# Patient Record
Sex: Male | Born: 1970 | Race: Black or African American | Hispanic: No | Marital: Single | Smoking: Current every day smoker
Health system: Southern US, Community
[De-identification: ages and names within clinical notes are randomized; demographics above are authoritative.]

## PROBLEM LIST (undated history)

## (undated) DIAGNOSIS — R079 Chest pain, unspecified: Secondary | ICD-10-CM

## (undated) DIAGNOSIS — F419 Anxiety disorder, unspecified: Secondary | ICD-10-CM

## (undated) DIAGNOSIS — K219 Gastro-esophageal reflux disease without esophagitis: Secondary | ICD-10-CM

## (undated) DIAGNOSIS — F32A Depression, unspecified: Secondary | ICD-10-CM

## (undated) DIAGNOSIS — Z87898 Personal history of other specified conditions: Secondary | ICD-10-CM

## (undated) HISTORY — DX: Anxiety disorder, unspecified: F41.9

## (undated) HISTORY — DX: Depression, unspecified: F32.A

## (undated) HISTORY — DX: Personal history of other specified conditions: Z87.898

## (undated) HISTORY — DX: Chest pain, unspecified: R07.9

## (undated) HISTORY — DX: Gastro-esophageal reflux disease without esophagitis: K21.9

---

## 2015-09-21 ENCOUNTER — Encounter (HOSPITAL_COMMUNITY): Payer: Self-pay | Admitting: Emergency Medicine

## 2015-09-21 ENCOUNTER — Inpatient Hospital Stay (HOSPITAL_COMMUNITY)
Admission: EM | Admit: 2015-09-21 | Discharge: 2015-09-25 | DRG: 392 | Disposition: A | Discharge status: Home / Self Care | Payer: Self-pay | Attending: Internal Medicine | Admitting: Internal Medicine

## 2015-09-21 ENCOUNTER — Emergency Department (HOSPITAL_COMMUNITY): Payer: Self-pay

## 2015-09-21 DIAGNOSIS — R0789 Other chest pain: Secondary | ICD-10-CM | POA: Diagnosis present

## 2015-09-21 DIAGNOSIS — Z7289 Other problems related to lifestyle: Secondary | ICD-10-CM | POA: Diagnosis present

## 2015-09-21 DIAGNOSIS — H9203 Otalgia, bilateral: Secondary | ICD-10-CM | POA: Diagnosis present

## 2015-09-21 DIAGNOSIS — Z720 Tobacco use: Secondary | ICD-10-CM | POA: Diagnosis present

## 2015-09-21 DIAGNOSIS — Z789 Other specified health status: Secondary | ICD-10-CM | POA: Diagnosis present

## 2015-09-21 DIAGNOSIS — K921 Melena: Secondary | ICD-10-CM | POA: Diagnosis present

## 2015-09-21 DIAGNOSIS — K209 Esophagitis, unspecified without bleeding: Secondary | ICD-10-CM

## 2015-09-21 DIAGNOSIS — F1010 Alcohol abuse, uncomplicated: Secondary | ICD-10-CM | POA: Diagnosis present

## 2015-09-21 DIAGNOSIS — H9209 Otalgia, unspecified ear: Secondary | ICD-10-CM | POA: Diagnosis present

## 2015-09-21 DIAGNOSIS — F17210 Nicotine dependence, cigarettes, uncomplicated: Secondary | ICD-10-CM | POA: Diagnosis present

## 2015-09-21 DIAGNOSIS — K210 Gastro-esophageal reflux disease with esophagitis: Principal | ICD-10-CM | POA: Diagnosis present

## 2015-09-21 DIAGNOSIS — H81391 Other peripheral vertigo, right ear: Secondary | ICD-10-CM | POA: Diagnosis present

## 2015-09-21 DIAGNOSIS — R072 Precordial pain: Secondary | ICD-10-CM | POA: Diagnosis present

## 2015-09-21 DIAGNOSIS — K922 Gastrointestinal hemorrhage, unspecified: Secondary | ICD-10-CM

## 2015-09-21 DIAGNOSIS — R079 Chest pain, unspecified: Secondary | ICD-10-CM | POA: Diagnosis present

## 2015-09-21 DIAGNOSIS — R066 Hiccough: Secondary | ICD-10-CM | POA: Diagnosis present

## 2015-09-21 DIAGNOSIS — K449 Diaphragmatic hernia without obstruction or gangrene: Secondary | ICD-10-CM | POA: Diagnosis present

## 2015-09-21 LAB — POC OCCULT BLOOD, ED: Fecal Occult Bld: POSITIVE — AB

## 2015-09-21 LAB — CBC WITH DIFFERENTIAL/PLATELET
Basophils Absolute: 0 10*3/uL (ref 0.0–0.1)
Basophils Relative: 0 %
EOS PCT: 2 %
Eosinophils Absolute: 0.1 10*3/uL (ref 0.0–0.7)
HCT: 38.7 % — ABNORMAL LOW (ref 39.0–52.0)
HEMOGLOBIN: 13.7 g/dL (ref 13.0–17.0)
LYMPHS ABS: 2.6 10*3/uL (ref 0.7–4.0)
LYMPHS PCT: 32 %
MCH: 31.4 pg (ref 26.0–34.0)
MCHC: 35.4 g/dL (ref 30.0–36.0)
MCV: 88.8 fL (ref 78.0–100.0)
Monocytes Absolute: 0.7 10*3/uL (ref 0.1–1.0)
Monocytes Relative: 8 %
Neutro Abs: 4.7 10*3/uL (ref 1.7–7.7)
Neutrophils Relative %: 58 %
PLATELETS: 259 10*3/uL (ref 150–400)
RBC: 4.36 MIL/uL (ref 4.22–5.81)
RDW: 13.1 % (ref 11.5–15.5)
WBC: 8.2 10*3/uL (ref 4.0–10.5)

## 2015-09-21 LAB — COMPREHENSIVE METABOLIC PANEL
ALK PHOS: 39 U/L (ref 38–126)
ALT: 16 U/L — AB (ref 17–63)
ANION GAP: 7 (ref 5–15)
AST: 21 U/L (ref 15–41)
Albumin: 4 g/dL (ref 3.5–5.0)
BUN: 23 mg/dL — AB (ref 6–20)
CO2: 26 mmol/L (ref 22–32)
Calcium: 9 mg/dL (ref 8.9–10.3)
Chloride: 104 mmol/L (ref 101–111)
Creatinine, Ser: 1.22 mg/dL (ref 0.61–1.24)
Glucose, Bld: 108 mg/dL — ABNORMAL HIGH (ref 65–99)
Potassium: 4 mmol/L (ref 3.5–5.1)
Sodium: 137 mmol/L (ref 135–145)
TOTAL PROTEIN: 6.7 g/dL (ref 6.5–8.1)
Total Bilirubin: 0.3 mg/dL (ref 0.3–1.2)

## 2015-09-21 LAB — D-DIMER, QUANTITATIVE (NOT AT ARMC): D DIMER QUANT: 0.31 ug{FEU}/mL (ref 0.00–0.50)

## 2015-09-21 LAB — PROTIME-INR
INR: 1.06 (ref 0.00–1.49)
Prothrombin Time: 13.6 seconds (ref 11.6–15.2)

## 2015-09-21 LAB — I-STAT TROPONIN, ED: Troponin i, poc: 0 ng/mL (ref 0.00–0.08)

## 2015-09-21 LAB — APTT: APTT: 28 s (ref 24–37)

## 2015-09-21 LAB — LIPASE, BLOOD: LIPASE: 20 U/L (ref 11–51)

## 2015-09-21 MED ORDER — ONDANSETRON HCL 4 MG/2ML IJ SOLN
4.0000 mg | Freq: Once | INTRAMUSCULAR | Status: AC
  Administered 2015-09-21: 4 mg via INTRAVENOUS
  Filled 2015-09-21: qty 2

## 2015-09-21 MED ORDER — PANTOPRAZOLE SODIUM 40 MG IV SOLR
40.0000 mg | Freq: Once | INTRAVENOUS | Status: AC
  Administered 2015-09-21: 40 mg via INTRAVENOUS
  Filled 2015-09-21: qty 40

## 2015-09-21 MED ORDER — GI COCKTAIL ~~LOC~~
30.0000 mL | Freq: Once | ORAL | Status: AC
  Administered 2015-09-21: 30 mL via ORAL
  Filled 2015-09-21: qty 30

## 2015-09-21 MED ORDER — HYDROMORPHONE HCL 1 MG/ML IJ SOLN
1.0000 mg | Freq: Once | INTRAMUSCULAR | Status: AC
  Administered 2015-09-22: 1 mg via INTRAVENOUS
  Filled 2015-09-21: qty 1

## 2015-09-21 MED ORDER — SODIUM CHLORIDE 0.9 % IV SOLN
8.0000 mg/h | INTRAVENOUS | Status: DC
  Administered 2015-09-21 – 2015-09-23 (×4): 8 mg/h via INTRAVENOUS
  Filled 2015-09-21 (×8): qty 80

## 2015-09-21 MED ORDER — FAMOTIDINE IN NACL 20-0.9 MG/50ML-% IV SOLN
20.0000 mg | Freq: Once | INTRAVENOUS | Status: AC
  Administered 2015-09-22: 20 mg via INTRAVENOUS
  Filled 2015-09-21: qty 50

## 2015-09-21 MED ORDER — SODIUM CHLORIDE 0.9 % IV BOLUS (SEPSIS)
2000.0000 mL | Freq: Once | INTRAVENOUS | Status: AC
Start: 2015-09-21 — End: 2015-09-22
  Administered 2015-09-21: 2000 mL via INTRAVENOUS

## 2015-09-21 NOTE — ED Notes (Signed)
Hospitalist at bedside to evaluate pt.

## 2015-09-21 NOTE — ED Notes (Signed)
Pt c/o central chest pain with hiccoughs x 5 days, pt awake very diaphoretic with n/v. Pt states he is now becoming shob.

## 2015-09-21 NOTE — ED Provider Notes (Signed)
CSN: [id number]     Arrival date & time 09/21/15  2102 History   First MD Initiated Contact with Patient 09/21/15 2119     Chief Complaint  Patient presents with  . Chest Pain     (Consider location/radiation/quality/duration/timing/severity/associated sxs/prior Treatment) HPI Patient presents with 5 days of persistent hiccups. He developed central chest pain 3 days ago. States the pain is worse with deep breathing and hiccuping. He also has had recent nausea and vomiting. Vomited multiple times today. Denies any diarrhea. No fever or chills. Has had increasing shortness of breath. Also complains of upper abdominal pain. Has noticed recent black stools. No new lower extremity swelling or tenderness. Patient has been on vacation in [place] and states he's had increased alcohol consumption and been eating spicy salsa.  She also complains of nasal congestion, sinus pressure and right ear pressure. States he had been scuba diving in [place] and had mild upper respiratory symptoms. States he has intermittent spinning sensation associated with nausea. History reviewed. No pertinent past medical history. History reviewed. No pertinent past surgical history. History reviewed. No pertinent family history. Social History  Substance Use Topics  . Smoking status: Current Every Day Smoker  . Smokeless tobacco: None  . Alcohol Use: Yes    Review of Systems  Constitutional: Negative for fever and chills.  HENT: Positive for congestion, ear pain and sinus pressure. Negative for sore throat.   Respiratory: Positive for shortness of breath. Negative for cough and wheezing.   Cardiovascular: Positive for chest pain.  Gastrointestinal: Positive for nausea, vomiting and abdominal pain. Negative for diarrhea and constipation.  Genitourinary: Negative for dysuria, frequency, hematuria and flank pain.  Musculoskeletal: Negative for myalgias, back pain, neck pain and neck stiffness.  Skin: Negative for rash and  wound.  Neurological: Negative for dizziness, weakness, light-headedness, numbness and headaches.  All other systems reviewed and are negative.     Allergies  Review of patient's allergies indicates no known allergies.  Home Medications   Prior to Admission medications   Not on File   BP 131/84 mmHg  Pulse 94  Resp 19  SpO2 100% Physical Exam  Constitutional: He is oriented to person, place, and time. He appears well-developed and well-nourished. No distress.  HENT:  Head: Normocephalic and atraumatic.  Mouth/Throat: Oropharynx is clear and moist. No oropharyngeal exudate.  Left frontal sinus tenderness with percussion. Right TM opacification.  Eyes: EOM are normal. Pupils are equal, round, and reactive to light.  Injected bilateral sclera  Neck: Normal range of motion. Neck supple. No JVD present.  Cardiovascular: Normal rate and regular rhythm.  Exam reveals no gallop and no friction rub.   No murmur heard. Pulmonary/Chest: Effort normal and breath sounds normal. No respiratory distress. He has no wheezes. He has no rales. He exhibits tenderness (mild inferior sternal tenderness ).  Abdominal: Soft. Bowel sounds are normal. He exhibits no distension and no mass. There is tenderness (Tenderness to palpation in the epigastric region.). There is no rebound and no guarding.  Musculoskeletal: Normal range of motion. He exhibits no edema or tenderness.  No lower extremity asymmetry, tenderness or swelling. No CVA tenderness bilaterally.  Neurological: He is alert and oriented to person, place, and time.  Patient is alert and oriented x3 with clear, goal oriented speech. Patient has 5/5 motor in all extremities. Sensation is intact to light touch.   Skin: Skin is warm and dry. No rash noted. No erythema.  Psychiatric: He has a normal mood and  affect. His behavior is normal.  Nursing note and vitals reviewed.   ED Course  Procedures (including critical care time) Labs  Review Labs Reviewed  CBC WITH DIFFERENTIAL/PLATELET - Abnormal; Notable for the following:    HCT 38.7 (*)    All other components within normal limits  COMPREHENSIVE METABOLIC PANEL - Abnormal; Notable for the following:    Glucose, Bld 108 (*)    BUN 23 (*)    ALT 16 (*)    All other components within normal limits  POC OCCULT BLOOD, ED - Abnormal; Notable for the following:    Fecal Occult Bld POSITIVE (*)    All other components within normal limits  LIPASE, BLOOD  D-DIMER, QUANTITATIVE (NOT AT [facility])  URINALYSIS, ROUTINE W REFLEX MICROSCOPIC (NOT AT [facility])  OCCULT BLOOD X 1 CARD TO LAB, STOOL  PROTIME-INR  APTT  MAGNESIUM  PHOSPHORUS  I-STAT TROPOININ, ED  TYPE AND SCREEN  TYPE AND SCREEN    Imaging Review Dg Chest 2 View  09/21/2015  CLINICAL DATA:  Shortness of breath EXAM: CHEST  2 VIEW COMPARISON:  None. FINDINGS: The heart size and mediastinal contours are within normal limits. Both lungs are clear. The visualized skeletal structures are unremarkable. IMPRESSION: No active cardiopulmonary disease. Electronically Signed   By: Marnee SpringJonathon  Watts M.D.   On: 09/21/2015 22:05   I have personally reviewed and evaluated these images and lab results as part of my medical decision-making.   EKG Interpretation   Date/Time:  Sunday September 21 2015 21:07:58 EDT Ventricular Rate:  77 PR Interval:  101 QRS Duration: 84 QT Interval:  346 QTC Calculation: 391 R Axis:   88 Text Interpretation:  Sinus rhythm Short PR interval Probable left atrial  enlargement Minimal ST elevation, anterior leads Baseline wander in  lead(s) V2 Confirmed by Maddisyn Hegwood  MD, Breckyn Troyer ([id number]) on 09/21/2015 10:43:29  PM      MDM   Final diagnoses:  Upper GI bleed  Peripheral vertigo involving right ear  Atypical chest pain  Hiccups   Chest pain and epigastric pain is improved with GI cocktail. No obvious on EKG. Initial troponin is normal. Doubt CAD given atypical presentation. Patient does have a  history of 15 years of daily smoking though no family history. No evidence of free air on chest x-ray. Guaiac is positive. Hemoglobin is stable. Start on Protonix push and drip. Discussed with hospitalist and will admit to telemetry bed. Also discussed with [facility]. We'll see patient in the hospital.     Loren Raceravid Eleana Tocco, MD 09/21/15 2350

## 2015-09-21 NOTE — ED Notes (Signed)
Patient transported to X-ray 

## 2015-09-21 NOTE — H&P (Signed)
History and Physical    Caleb Brimaj Auxier UVO:[id number] DOB: [birth year] DOA: 09/21/2015  Referring MD/NP/PA: Loren Raceravid Yelverton, MD PCP: No primary care provider on file.  Outpatient Specialists:  Patient coming from: Home.  Chief Complaint: Chest pain.  HPI: Caleb Mcclure is a 45 y.o. male with no previous medical history who comes to the emergency department with complaints of hiccups for the past 5 days. He also has had chest pain, epigastric pain, nausea and vomiting for the last 3 days.  Per patient, he was doing well until earlier this week when he developed hiccups with some chest discomfort associated to it. The patient had booked a vacation in [place] for a few days and traveled there, even though he was feeling the symptoms. He is here states that during the week he was having some abdominal discomfort, but symptoms got a lot worse Friday evening after he ate a steak. He started having nausea and emesis. He says that prior to that, he was enjoying his vacation, went scuba diving on Friday, was eating and having alcoholic beverages offered by the resort.  He also states that earlier in the day, he noticed an episode of melena.   After the symptoms persisted, he went to an emergency department [place] [place] Friday evening and was given IV Valium. He states that this helped, but his symptoms persisted through the weekend. He was able to fly back today, but had to come to the emergency department after the abdominal pain, N/V worsened.  He denies using iron tablets, using bismuth or any other foods or medicine that would give him melena. However, the patient states that he use Pepto-Bismol earlier today to try to relieve his symptoms. He denies any history of gastritis, PUD or any other gastrointestinal disorders.  He also complains of right earache after the scuba diving session.  ED Course: Workup in the ER is unremarkable, except for positive fecal occult blood and mild ST segment  elevation on EKG.  Review of Systems: As per HPI otherwise 10 point review of systems negative.  History reviewed. No pertinent past medical history.  History reviewed. No pertinent past surgical history.   reports that he has been smoking.  He does not have any smokeless tobacco history on file. He reports that he drinks alcohol. He reports that he does not use illicit drugs.  No Known Allergies  History reviewed. No pertinent family history per patient.   Prior to Admission medications   Not on File    Physical Exam: Filed Vitals:   09/21/15 2117 09/21/15 2253  BP: 143/77 131/84  Pulse: 77 94  Resp: 20 19  SpO2: 100% 100%      Constitutional: NAD, calm, comfortable Filed Vitals:   09/21/15 2117 09/21/15 2253  BP: 143/77 131/84  Pulse: 77 94  Resp: 20 19  SpO2: 100% 100%   Eyes: PERRL, lids and conjunctivae normal ENMT: Mucous membranes are moist. Posterior pharynx clear of any exudate or lesions.Normal dentition.  Neck: normal, supple, no masses, no thyromegaly Respiratory: clear to auscultation bilaterally, no wheezing, no crackles. Normal respiratory effort. No accessory muscle use.  Cardiovascular: Regular rate and rhythm, no murmurs / rubs / gallops. No extremity edema. 2+ pedal pulses. No carotid bruits.  Abdomen: No distention. Bowel sounds positive. Mild epigastric tenderness without guarding or rebound, no masses palpated. No hepatosplenomegaly.  Musculoskeletal: no clubbing / cyanosis. No joint deformity upper and lower extremities. Good ROM, no contractures. Normal muscle tone.  Skin:  no rashes, lesions, ulcers. No induration Neurologic: CN 2-12 grossly intact. Sensation intact, DTR normal. Strength 5/5 in all 4.  Psychiatric: Normal judgment and insight. Alert and oriented x 3. Normal mood.   Labs on Admission: I have personally reviewed following labs and imaging studies  CBC:  Recent Labs Lab 09/21/15 2151  WBC 8.2  NEUTROABS 4.7  HGB 13.7    HCT 38.7*  MCV 88.8  PLT 259   Basic Metabolic Panel:  Recent Labs Lab 09/21/15 2151  NA 137  K 4.0  CL 104  CO2 26  GLUCOSE 108*  BUN 23*  CREATININE 1.22  CALCIUM 9.0   GFR: CrCl cannot be calculated (Unknown ideal weight.). Liver Function Tests:  Recent Labs Lab 09/21/15 2151  AST 21  ALT 16*  ALKPHOS 39  BILITOT 0.3  PROT 6.7  ALBUMIN 4.0    Recent Labs Lab 09/21/15 2151  LIPASE 20    Radiological Exams on Admission: Dg Chest 2 View  09/21/2015  CLINICAL DATA:  Shortness of breath EXAM: CHEST  2 VIEW COMPARISON:  None. FINDINGS: The heart size and mediastinal contours are within normal limits. Both lungs are clear. The visualized skeletal structures are unremarkable. IMPRESSION: No active cardiopulmonary disease. Electronically Signed   By: Marnee Spring M.D.   On: 09/21/2015 22:05    EKG: Independently reviewed. Vent. rate 77 BPM PR interval 101 ms QRS duration 84 ms QT/QTc 346/391 ms P-R-T axes 56 88 51 Sinus rhythm Short PR interval Probable left atrial enlargement Minimal ST elevation, anterior leads Baseline wander in lead(s) V2  Assessment/Plan Principal Problem:   UGI bleed Likely from a combination of stress, daily caffeine intake, tobacco and alcohol use, foot intake during vacation.   Admit to telemetry/inpatient. Nothing by mouth. Continue IV fluids. Continue pantoprazole infusion. Analgesics as needed. Antiemetics as needed. Monitor hematocrit and hemoglobin. GI will evaluate in a.m.  Active Problems:   Chest pain Continue cardiac monitoring. Trend troponin levels. Repeat EKG in a.m. Check echocardiogram in the morning.    Tobacco abuse disorder The patient states that he is very committed to quitting smoking. Nicotine replacement therapy ordered.    DVT prophylaxis: SCDs. Code Status: Full code. Family Communication:  Disposition Plan: Admit for monitoring and GI evaluation. Consults called:  Gastroenterology. Admission status: Inpatient/telemetry.   About 70 minutes were used in the process of this admission.   Bobette Mo MD [facility] Pager [phone].  If 7PM-7AM, please contact night-coverage [email or web address] Password [facility]  09/21/2015, 11:29 PM

## 2015-09-22 ENCOUNTER — Inpatient Hospital Stay (HOSPITAL_COMMUNITY): Payer: Self-pay

## 2015-09-22 DIAGNOSIS — K922 Gastrointestinal hemorrhage, unspecified: Secondary | ICD-10-CM

## 2015-09-22 DIAGNOSIS — Z720 Tobacco use: Secondary | ICD-10-CM

## 2015-09-22 DIAGNOSIS — H9203 Otalgia, bilateral: Secondary | ICD-10-CM

## 2015-09-22 DIAGNOSIS — R0789 Other chest pain: Secondary | ICD-10-CM | POA: Diagnosis present

## 2015-09-22 DIAGNOSIS — R079 Chest pain, unspecified: Secondary | ICD-10-CM

## 2015-09-22 LAB — PHOSPHORUS: Phosphorus: 3.4 mg/dL (ref 2.5–4.6)

## 2015-09-22 LAB — CBC
HCT: 37.7 % — ABNORMAL LOW (ref 39.0–52.0)
Hemoglobin: 13 g/dL (ref 13.0–17.0)
MCH: 31.6 pg (ref 26.0–34.0)
MCHC: 34.5 g/dL (ref 30.0–36.0)
MCV: 91.7 fL (ref 78.0–100.0)
PLATELETS: 236 10*3/uL (ref 150–400)
RBC: 4.11 MIL/uL — AB (ref 4.22–5.81)
RDW: 13.3 % (ref 11.5–15.5)
WBC: 7.1 10*3/uL (ref 4.0–10.5)

## 2015-09-22 LAB — URINALYSIS, ROUTINE W REFLEX MICROSCOPIC
Bilirubin Urine: NEGATIVE
GLUCOSE, UA: NEGATIVE mg/dL
HGB URINE DIPSTICK: NEGATIVE
KETONES UR: NEGATIVE mg/dL
Leukocytes, UA: NEGATIVE
Nitrite: NEGATIVE
PH: 7 (ref 5.0–8.0)
PROTEIN: NEGATIVE mg/dL
Specific Gravity, Urine: 1.025 (ref 1.005–1.030)

## 2015-09-22 LAB — LIPID PANEL
CHOLESTEROL: 131 mg/dL (ref 0–200)
HDL: 53 mg/dL (ref >40)
LDL Cholesterol: 63 mg/dL (ref 0–99)
TRIGLYCERIDES: 75 mg/dL (ref <150)
Total CHOL/HDL Ratio: 2.5 RATIO
VLDL: 15 mg/dL (ref 0–40)

## 2015-09-22 LAB — CBC WITH DIFFERENTIAL/PLATELET
Basophils Absolute: 0 10*3/uL (ref 0.0–0.1)
Basophils Relative: 0 %
EOS ABS: 0.1 10*3/uL (ref 0.0–0.7)
Eosinophils Relative: 1 %
HCT: 36.3 % — ABNORMAL LOW (ref 39.0–52.0)
HEMOGLOBIN: 12.6 g/dL — AB (ref 13.0–17.0)
LYMPHS ABS: 3.9 10*3/uL (ref 0.7–4.0)
LYMPHS PCT: 42 %
MCH: 31.8 pg (ref 26.0–34.0)
MCHC: 34.7 g/dL (ref 30.0–36.0)
MCV: 91.7 fL (ref 78.0–100.0)
Monocytes Absolute: 0.5 10*3/uL (ref 0.1–1.0)
Monocytes Relative: 6 %
NEUTROS ABS: 4.7 10*3/uL (ref 1.7–7.7)
NEUTROS PCT: 51 %
Platelets: 237 10*3/uL (ref 150–400)
RBC: 3.96 MIL/uL — AB (ref 4.22–5.81)
RDW: 13.2 % (ref 11.5–15.5)
WBC: 9.3 10*3/uL (ref 4.0–10.5)

## 2015-09-22 LAB — COMPREHENSIVE METABOLIC PANEL
ALK PHOS: 35 U/L — AB (ref 38–126)
ALT: 14 U/L — AB (ref 17–63)
AST: 16 U/L (ref 15–41)
Albumin: 3.5 g/dL (ref 3.5–5.0)
Anion gap: 4 — ABNORMAL LOW (ref 5–15)
BUN: 18 mg/dL (ref 6–20)
CALCIUM: 8.2 mg/dL — AB (ref 8.9–10.3)
CO2: 26 mmol/L (ref 22–32)
CREATININE: 1.22 mg/dL (ref 0.61–1.24)
Chloride: 110 mmol/L (ref 101–111)
GFR calc non Af Amer: >60 mL/min (ref >60)
GLUCOSE: 104 mg/dL — AB (ref 65–99)
Potassium: 4.5 mmol/L (ref 3.5–5.1)
SODIUM: 140 mmol/L (ref 135–145)
Total Bilirubin: 0.6 mg/dL (ref 0.3–1.2)
Total Protein: 5.9 g/dL — ABNORMAL LOW (ref 6.5–8.1)

## 2015-09-22 LAB — TYPE AND SCREEN
ABO/RH(D): B POS
Antibody Screen: NEGATIVE

## 2015-09-22 LAB — PROTIME-INR
INR: 1.14 (ref 0.00–1.49)
PROTHROMBIN TIME: 14.3 s (ref 11.6–15.2)

## 2015-09-22 LAB — ECHOCARDIOGRAM COMPLETE
HEIGHTINCHES: 73 in
WEIGHTICAEL: 3531.2 [oz_av]

## 2015-09-22 LAB — ABO/RH: ABO/RH(D): B POS

## 2015-09-22 LAB — RAPID URINE DRUG SCREEN, HOSP PERFORMED
AMPHETAMINES: NOT DETECTED
BARBITURATES: NOT DETECTED
BENZODIAZEPINES: POSITIVE — AB
COCAINE: NOT DETECTED
Opiates: NOT DETECTED
TETRAHYDROCANNABINOL: NOT DETECTED

## 2015-09-22 LAB — MAGNESIUM: Magnesium: 2 mg/dL (ref 1.7–2.4)

## 2015-09-22 LAB — TROPONIN I
Troponin I: <0.03 ng/mL (ref <0.031)
Troponin I: <0.03 ng/mL (ref <0.031)

## 2015-09-22 MED ORDER — MENTHOL 3 MG MT LOZG
1.0000 | LOZENGE | OROMUCOSAL | Status: DC | PRN
  Administered 2015-09-22: 3 mg via ORAL
  Filled 2015-09-22 (×2): qty 9

## 2015-09-22 MED ORDER — SUCRALFATE 1 GM/10ML PO SUSP
1.0000 g | Freq: Three times a day (TID) | ORAL | Status: DC
  Administered 2015-09-22 – 2015-09-25 (×9): 1 g via ORAL
  Filled 2015-09-22 (×14): qty 10

## 2015-09-22 MED ORDER — CIPROFLOXACIN-HYDROCORTISONE 0.2-1 % OT SUSP
3.0000 [drp] | Freq: Two times a day (BID) | OTIC | Status: DC
Start: 2015-09-22 — End: 2015-09-25
  Administered 2015-09-22 – 2015-09-24 (×6): 3 [drp] via OTIC
  Filled 2015-09-22: qty 10

## 2015-09-22 MED ORDER — SODIUM CHLORIDE 0.9% FLUSH
3.0000 mL | Freq: Two times a day (BID) | INTRAVENOUS | Status: DC
Start: 2015-09-22 — End: 2015-09-25
  Administered 2015-09-23: 3 mL via INTRAVENOUS

## 2015-09-22 MED ORDER — CHLORPROMAZINE HCL 10 MG PO TABS
10.0000 mg | ORAL_TABLET | Freq: Three times a day (TID) | ORAL | Status: DC | PRN
Start: 2015-09-22 — End: 2015-09-22
  Administered 2015-09-22: 10 mg via ORAL
  Filled 2015-09-22 (×3): qty 1

## 2015-09-22 MED ORDER — ONDANSETRON HCL 4 MG/2ML IJ SOLN
4.0000 mg | Freq: Four times a day (QID) | INTRAMUSCULAR | Status: DC | PRN
  Administered 2015-09-22: 4 mg via INTRAVENOUS
  Filled 2015-09-22: qty 2

## 2015-09-22 MED ORDER — CHLORPROMAZINE HCL 25 MG/ML IJ SOLN
12.5000 mg | Freq: Four times a day (QID) | INTRAMUSCULAR | Status: DC | PRN
  Administered 2015-09-22 – 2015-09-24 (×7): 12.5 mg via INTRAVENOUS
  Filled 2015-09-22 (×14): qty 0.5

## 2015-09-22 MED ORDER — HYDROMORPHONE HCL 1 MG/ML IJ SOLN
1.0000 mg | Freq: Four times a day (QID) | INTRAMUSCULAR | Status: DC | PRN
  Administered 2015-09-22 – 2015-09-24 (×10): 1 mg via INTRAVENOUS
  Filled 2015-09-22 (×11): qty 1

## 2015-09-22 MED ORDER — SODIUM CHLORIDE 0.9 % IV SOLN
INTRAVENOUS | Status: AC
  Administered 2015-09-22: via INTRAVENOUS

## 2015-09-22 MED ORDER — NICOTINE 21 MG/24HR TD PT24
21.0000 mg | MEDICATED_PATCH | Freq: Every day | TRANSDERMAL | Status: DC
  Administered 2015-09-22 – 2015-09-25 (×5): 21 mg via TRANSDERMAL
  Filled 2015-09-22 (×5): qty 1

## 2015-09-22 MED ORDER — CIPROFLOXACIN-FLUOCINOLONE PF 0.3-0.025 % OT SOLN: 0.2500 mL | Freq: Two times a day (BID) | OTIC | Status: DC

## 2015-09-22 MED ORDER — SODIUM CHLORIDE 0.9 % IV SOLN
INTRAVENOUS | Status: DC
  Administered 2015-09-22: 08:00:00 via INTRAVENOUS
  Administered 2015-09-22: 125 mL/h via INTRAVENOUS

## 2015-09-22 NOTE — Consult Note (Signed)
CARDIOLOGY CONSULT NOTE   Patient ID: Caleb Mcclure MRN: [id number] DOB/AGE: 04-Jan-1971 45 y.o.  Admit date: 09/21/2015  Primary Physician   No primary care provider on file. Primary Cardiologist   New Reason for Consultation  Chest discomfort Requesting Physician  Dr. Sidney Ace  HPI: Caleb Mcclure is a 45 y.o. male past medical history who came to [facility] emergency department for evaluation of epigastric pain, nausea, vomiting and hiccups.  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 Tuesday 4/18 he woke up with the dizziness and hiccups. His hiccups persisted and he went for vacation (previously booked) in [place], [place] Thursday 4/19. He take water with him. However during vacation his symptoms get worse and he later developed a abdominal/epigastric discomfort. He describes the pain as a squeezing in character. He also has some midsternal tight pain. He ate some steak on Friday 4/21 evening and later developed. Severe nausea with vomiting of coffee-colored stuff "different from food". He also went for scuba diving on Friday. He was seen in emergency department during vacation and was given IV Valium and discharged. His symptoms persisted and worsened over the weekend. He flew back yesterday 4/23 and directly came to [facility] long ED for further evaluation. He did use a one dose of Pepto-Bismol yesterday. Denies similar episode in the past. He swims 60 laps every day. No chest pain at that time. Patient smokes one pack of cigarettes every day for the past 15 years. He drinks approximately 2 beers, one glass of fine and one glass of water, during weekend. Admits to drinking heavily during vacation. Admits to having history of hemorrhoids.  In ER workup is unremarkable except positive fecal occult blood stool. EKG shows sinus rhythm @ Vent. rate 77 BPM; PR interval 101 ms, baseline ST segment elevation in inferior lead. No prior EKG to compare. Telemetry  shows sinus rhythm with a rate mostly in the 60s, PACs. No arrhythmia noted. Point-of-care troponin negative. Troponin I negative. Lytes normal. 60 clear.   The patient denies any family history of stroke or heart attack. Denies use of illicit drug use.  Denies family history of congenital heart disease. The patient denies fever, palpitations, shortness of breath, orthopnea, PND, dizziness, syncope, cough, congestion, lower extremity edema.    No past medical history or surgical history.   No Known Allergies  I have reviewed the patient's current medications . ciprofloxacin-hydrocortisone  3 drop Right Ear BID  . nicotine  21 mg Transdermal Daily  . sodium chloride flush  3 mL Intravenous Q12H   . sodium chloride 125 mL/hr at 09/22/15 0752  . pantoprozole (PROTONIX) infusion 8 mg/hr (09/22/15 0752)   chlorproMAZINE, HYDROmorphone (DILAUDID) injection, ondansetron (ZOFRAN) IV  Prior to Admission medications   Not on File     Social History   Social History  . Marital Status: Single    Spouse Name: N/A  . Number of Children: N/A  . Years of Education: N/A   Occupational History  . Not on file.   Social History Main Topics  . Smoking status: Current Every Day Smoker  . Smokeless tobacco: Not on file  . Alcohol Use: Yes  . Drug Use: No  . Sexual Activity: Not on file   Other Topics Concern  . Not on file   Social History Narrative  . No narrative on file    No family status information on file.    Family hx: Denies any family history of stroke or  heart attack.   ROS:  Full 14 point review of systems complete and found to be negative unless listed above.  Physical Exam: Blood pressure 112/59, pulse 72, temperature 98 F (36.7 C), temperature source Oral, resp. rate 18, height 6\' 1"  (1.854 m), weight 220 lb 11.2 oz (100.109 kg), SpO2 100 %.  General: Well developed, well nourished, male in no acute distress Head: Eyes PERRLA, No xanthomas. Normocephalic and  atraumatic, oropharynx without edema or exudate.  Lungs: Resp regular and unlabored, CTA. Heart: RRR no s3, s4, or murmurs..   Neck: No carotid bruits. No lymphadenopathy. No JVD. Abdomen: Bowel sounds present, tender to palpation in the epigastric area. Msk:  No spine or cva tenderness. No weakness, no joint deformities or effusions. Extremities: No clubbing, cyanosis or edema. DP/PT/Radials 2+ and equal bilaterally. Neuro: Alert and oriented X 3. No focal deficits noted. Psych:  Good affect, responds appropriately Skin: No rashes or lesions noted.  Labs:   Lab Results  Component Value Date   WBC 9.3 09/22/2015   HGB 12.6* 09/22/2015   HCT 36.3* 09/22/2015   MCV 91.7 09/22/2015   PLT 237 09/22/2015    Recent Labs  09/22/15 0509  INR 1.14    Recent Labs Lab 09/22/15 0509  NA 140  K 4.5  CL 110  CO2 26  BUN 18  CREATININE 1.22  CALCIUM 8.2*  PROT 5.9*  BILITOT 0.6  ALKPHOS 35*  ALT 14*  AST 16  GLUCOSE 104*  ALBUMIN 3.5   MAGNESIUM  Date Value Ref Range Status  09/21/2015 2.0 1.7 - 2.4 mg/dL Final    Recent Labs  40/98/1104/24/17 0509  TROPONINI <0.03    Recent Labs  09/21/15 2203  TROPIPOC 0.00   No results found for: PROBNP No results found for: CHOL, HDL, LDLCALC, TRIG Lab Results  Component Value Date   DDIMER 0.31 09/21/2015   LIPASE  Date/Time Value Ref Range Status  09/21/2015 09:51 PM 20 11 - 51 U/L Final   ECG:   EKG shows sinus rhythm @ Vent. rate 77 BPM; PR interval 101 ms, baseline ST segment elevation in inferior lead.   Radiology:  Dg Chest 2 View  09/21/2015  CLINICAL DATA:  Shortness of breath EXAM: CHEST  2 VIEW COMPARISON:  None. FINDINGS: The heart size and mediastinal contours are within normal limits. Both lungs are clear. The visualized skeletal structures are unremarkable. IMPRESSION: No active cardiopulmonary disease. Electronically Signed   By: Marnee SpringJonathon  Watts M.D.   On: 09/21/2015 22:05    ASSESSMENT AND PLAN:     1.  Chest pain - His chest pain is very atypical. He states that he had a few episode of mid sternal chest tightness with hiccups only. EKG shows sinus rhythm @ Vent. rate 77 BPM; PR interval 101 ms, baseline ST segment elevation in inferior lead.  Troponin negative. Tele showed sinus rhythm at rate of 60s with PACs. No arrhythmia.  - Cardiac risk factors include tobacco smoking and alcohol drinking. Will get lipid panel for stratification.  - Will get echocardiogram. If normal EF and no wall motion abnormality, no further workup needed. He swims 60 laps everyday without any chest pain or shortness of breath. Check urine drug screen.   2. Tobacco smoking - Advised smoking cessation. Education given.   3. UGI bleed - Pending GI consult. Hx of hemorrhoids.    SignedManson Passey: Kathrine Rieves, PA 09/22/2015, 10:11 AM Pager [phone]  Co-Sign MD

## 2015-09-22 NOTE — Care Management Note (Signed)
Case Management Note  Patient Details  Name: Laurian Brimaj Hollern MRN: [id number] Date of Birth: 12-04-1970  Subjective/Objective: 45 y/o m admitted w/UGIB. From home. Patient works. No health insurance-provided info for pcp, health insurance,meds. [facility]-TC [facility] liason w/[facility]-they will check on appt.[facility]-patient voiced understanding.                   Action/Plan:d/c plan home.   Expected Discharge Date:                 Expected Discharge Plan:  Home/Self Care  In-House Referral:     Discharge planning Services  CM Consult, Indigent [facility] Clinic  Post Acute Care Choice:    Choice offered to:     DME Arranged:    DME Agency:     HH Arranged:    HH Agency:     Status of Service:  In process, will continue to follow  Medicare Important Message Given:    Date Medicare IM Given:    Medicare IM give by:    Date Additional Medicare IM Given:    Additional Medicare Important Message give by:     If discussed at Long Length of Stay Meetings, dates discussed:    Additional Comments:  Lanier ClamMahabir, Harlem Bula, RN 09/22/2015, 1:00 PM

## 2015-09-22 NOTE — Consult Note (Signed)
[facility] Consultation Note  Referring Provider: Dr. Anand Hongalgi ([facility]) Primary Care Physician:  No primary care provider on file.  Reason for Consultation:  Coffee ground emesis; dark stools  HPI: Caleb Mcclure is a 44 y.o. male whom we've been asked to see for one week history of hiccoughs, and several day history of coffee ground emesis and dark stools.  No abdominal pain.  No bright red hematemesis or hematochezia.  No prior similar symptoms.  No NSAIDs.  Recent trip to [place], where he ate lots of seafood throughout and drank alcohol for one day.   History reviewed. No pertinent past medical history.  History reviewed. No pertinent past surgical history.  Prior to Admission medications   Not on File    Current Facility-Administered Medications  Medication Dose Route Frequency Provider Last Rate Last Dose  . 0.9 %  sodium chloride infusion   Intravenous Continuous Anand D Hongalgi, MD 100 mL/hr at 09/22/15 1130    . chlorproMAZINE (THORAZINE) 12.5 mg in sodium chloride 0.9 % 25 mL IVPB  12.5 mg Intravenous Q6H PRN Anand D Hongalgi, MD   12.5 mg at 09/22/15 1120  . ciprofloxacin-hydrocortisone (CIPRO HC OTIC) 0.2-1 % otic suspension 3 drop  3 drop Right Ear BID David Manuel Ortiz, MD   3 drop at 09/22/15 0926  . HYDROmorphone (DILAUDID) injection 1 mg  1 mg Intravenous Q6H PRN David Manuel Ortiz, MD   1 mg at 09/22/15 1117  . menthol-cetylpyridinium (CEPACOL) lozenge 3 mg  1 lozenge Oral PRN Anand D Hongalgi, MD      . nicotine (NICODERM CQ - dosed in mg/24 hours) patch 21 mg  21 mg Transdermal Daily David Manuel Ortiz, MD   21 mg at 09/22/15 0919  . ondansetron (ZOFRAN) injection 4 mg  4 mg Intravenous Q6H PRN David Manuel Ortiz, MD   4 mg at 09/22/15 0057  . pantoprazole (PROTONIX) 80 mg in sodium chloride 0.9 % 250 mL (0.32 mg/mL) infusion  8 mg/hr Intravenous Continuous David Yelverton, MD 25 mL/hr at 09/22/15 0752 8 mg/hr at 09/22/15 0752  . sodium chloride flush (NS)  0.9 % injection 3 mL  3 mL Intravenous Q12H David Manuel Ortiz, MD   3 mL at 09/22/15 0139  . sucralfate (CARAFATE) 1 GM/10ML suspension 1 g  1 g Oral TID WC & HS Anand D Hongalgi, MD   1 g at 09/22/15 1204    Allergies as of 09/21/2015  . (No Known Allergies)    History reviewed. No pertinent family history.  Social History   Social History  . Marital Status: Single    Spouse Name: N/A  . Number of Children: N/A  . Years of Education: N/A   Occupational History  . Not on file.   Social History Main Topics  . Smoking status: Current Every Day Smoker  . Smokeless tobacco: Not on file  . Alcohol Use: Yes  . Drug Use: No  . Sexual Activity: Not on file   Other Topics Concern  . Not on file   Social History Narrative  . No narrative on file    Review of Systems: As per HPI, all others negative  Physical Exam: Vital signs in last 24 hours: Temp:  [97.7 F (36.5 C)-98.7 F (37.1 C)] 97.7 F (36.5 C) (04/24 1230) Pulse Rate:  [69-94] 69 (04/24 1230) Resp:  [16-22] 16 (04/24 1230) BP: (112-143)/(50-84) 113/50 mmHg (04/24 1230) SpO2:  [98 %-100 %] 98 % (04/24 1230) Weight:  [100.109 kg (  220 lb 11.2 oz)] 100.109 kg (220 lb 11.2 oz) (04/24 0029) Last BM Date: 09/21/15 General:   Alert,  Well-developed, well-nourished, pleasant and cooperative in NAD; visibly has multiple hiccoughs during my exam session Head:  Normocephalic and atraumatic. Eyes:  Sclera clear, no icterus.   Conjunctiva pink. Ears:  Normal auditory acuity. Nose:  No deformity, discharge,  or lesions. Mouth:  No deformity or lesions.  Oropharynx pink but dry Neck:  Supple; no masses or thyromegaly. Lungs:  Clear throughout to auscultation.   No wheezes, crackles, or rhonchi. No acute distress. Heart:  Regular rate and rhythm; no murmurs, clicks, rubs,  or gallops. Abdomen:  Soft, nontender and nondistended. No masses, hepatosplenomegaly or hernias noted. Normal bowel sounds, without guarding, and without  rebound.     Msk:  Symmetrical without gross deformities. Normal posture. Pulses:  Normal pulses noted. Extremities:  Without clubbing or edema. Neurologic:  Alert and  oriented x4;  grossly normal neurologically. Skin:  Intact without significant lesions or rashes. Psych:  Alert and cooperative. Normal mood and affect.   Lab Results:  Recent Labs  09/21/15 2151 09/22/15 0509 09/22/15 1000  WBC 8.2 9.3 7.1  HGB 13.7 12.6* 13.0  HCT 38.7* 36.3* 37.7*  PLT 259 237 236   BMET  Recent Labs  09/21/15 2151 09/22/15 0509  NA 137 140  K 4.0 4.5  CL 104 110  CO2 26 26  GLUCOSE 108* 104*  BUN 23* 18  CREATININE 1.22 1.22  CALCIUM 9.0 8.2*   LFT  Recent Labs  09/22/15 0509  PROT 5.9*  ALBUMIN 3.5  AST 16  ALT 14*  ALKPHOS 35*  BILITOT 0.6   PT/INR  Recent Labs  09/21/15 2332 09/22/15 0509  LABPROT 13.6 14.3  INR 1.06 1.14    Studies/Results: Dg Chest 2 View  09/21/2015  CLINICAL DATA:  Shortness of breath EXAM: CHEST  2 VIEW COMPARISON:  None. FINDINGS: The heart size and mediastinal contours are within normal limits. Both lungs are clear. The visualized skeletal structures are unremarkable. IMPRESSION: No active cardiopulmonary disease. Electronically Signed   By: Jonathon  Watts M.D.   On: 09/21/2015 22:05   Impression:  1.  Hiccoughs. 2.  Chest discomfort.  Cardiac work-up thus far unrevealing, and symptoms not typical of cardiac etiology. 3.  Coffee ground emesis. 4.  Dark stools.  Plan:  1.  Full liquid diet tonight. 2.  NPO after midnight. 3.  Thorazine administration for hiccoughs in progress. 4.  PPI + sucralfate. 5.  Endoscopy tomorrow ~ 12:30 pm. 6.  Risks (bleeding, infection, bowel perforation that could require surgery, sedation-related changes in cardiopulmonary systems), benefits (identification and possible treatment of source of symptoms, exclusion of certain causes of symptoms), and alternatives (watchful waiting, radiographic imaging  studies, empiric medical treatment) of upper endoscopy (EGD) were explained to patient/family in detail and patient wishes to proceed.   LOS: 1 day   Felix Meras M  09/22/2015, 2:11 PM  Pager [phone] If no answer or after 5 PM call [phone]  

## 2015-09-22 NOTE — Progress Notes (Signed)
PROGRESS NOTE  Caleb Mcclure  ONG:[id number] DOB: May 29, 1971  DOA: 09/21/2015 PCP: No primary care provider on file.  Outpatient Specialists:  None  Brief Narrative:  45 year old male with no known PMH, ongoing tobacco abuse, alcohol use on weekends, physically very active (swims 60 labs per day up to 5 days per week and does weights), presented to the ED on 4/23 with 6 days history of ongoing hiccups, retrosternal spasm/discomfort, intermittent vomiting with coffee-grounds & couple of episodes of black tarry stools. Patient in usual state of health until 6 days ago when he started having hiccups followed by retrosternal discomfort/cramping. Next day he traveled to [place] on vacation. Symptoms progressively worsened, drank alcohol for the first day during vacation, had couple episodes of coffee-ground emesis, did do scuba diving after taking some Dramamine, seen in ED there for worsening symptoms, had 2 episodes of black tarry stools prior to starting Pepto-Bismol-advised hospitalization in [place] but he declined, was given Valium, flew back to [facility] 4/23. On landing in [place], noted pain in years without hearing loss, had couple more episodes of vomiting and presented to the ED. Admitted for acute upper GI bleed and atypical chest pain-likely GI related. [facility] consulted and request cardiac clearance/echo prior to nonemergent EGD possibly 4/25.   Assessment & Plan:   Principal Problem:   UGI bleed Active Problems:   Chest pain   Tobacco abuse disorder   Acute upper GI bleed - DD: Acute esophagitis, gastritis, PUD, MW tear versus other etiologies. - Treating supportively with bowel rest, IV fluids, IV PPI infusion. Add Sucralfate. - Communicated with [facility] who suggest echo prior to nonemergent EGD 4/25. - Hemodynamically stable. Hemoglobin stable suggesting mild GI bleed.  Hiccups - May be related to above. No significant improvement with oral chlorpromazine, will change to IV  when necessary. Continue PPI.  Atypical chest pain - Likely GI in origin. - Patient physically quite active. No prior PMH or family history of heart disease. - EKG: Sinus rhythm without acute changes. I do not appreciate ST segment elevation. Normal QTC. 2 troponins thus far negative. Telemetry: Sinus rhythm without arrhythmias. Chest x-ray without acute findings. - 2-D echo: Pending.  - Cardiology consulted.   Tobacco abuse - Cessation counseled. Continue nicotine patch.  Alcohol use/? Abuse - Moderation counseled. States that he drinks a couple of vodka drinks on Fridays and Saturdays.  Bilateral earache - Occurred after landing after flight journey from [place]. Right earache has resolved. Left earache better. No hearing loss or discharge. May be related to their cabin pressure/barotrauma. Monitor. If does not improve or worsens, outpatient ENT consultation.   DVT prophylaxis: SCD's Code Status: Full Family Communication: Discussed with patient. No family at bedside.  Disposition Plan: DC home when medically stable, possibly in the next 1-2 days.    Consultants:   [facility]  Procedures:   None  Antimicrobials:   None    Subjective: Ongoing hiccups. Lower substernal/epigastric discomfort associated with hiccups. Right earache has resolved. Left earache improved. No further vomiting or BM reported.   Objective:  Filed Vitals:   09/21/15 2117 09/21/15 2253 09/22/15 0029 09/22/15 0516  BP: 143/77 131/84 120/70 112/59  Pulse: 77 94 83 72  Temp:   98.7 F (37.1 C) 98 F (36.7 C)  TempSrc:   Oral Oral  Resp: Height:    (1.854 m)   Weight:   100.109 kg (220 lb 11.2 oz)   SpO2: 100% 100%  100% 100%    Intake/Output Summary (Last 24 hours) at 09/22/15 1050 Last data filed at 09/22/15 0800  Gross per 24 hour  Intake   1090 ml  Output    350 ml  Net    740 ml   Filed Weights   09/22/15 0029  Weight: 100.109 kg (220 lb 11.2 oz)     Examination:  General exam: Pleasant young male, moderately built and nourished, lying comfortably supine in bed. Appears calm and comfortable  Respiratory system: Clear to auscultation. Respiratory effort normal. Cardiovascular system: S1 & S2 heard, RRR.Marland Kitchen. No JVD, murmurs, rubs, gallops or clicks. No pedal edema. Telemetry: SB 50's-SR. Gastrointestinal system: Abdomen is nondistended, soft. Mild epigastric tenderness without peritoneal signs. No organomegaly or masses felt. Normal bowel sounds heard. Central nervous system: Alert and oriented. No focal neurological deficits. Extremities: Symmetric 5 x 5 power. Skin: No rashes, lesions or ulcers Psychiatry: Judgement and insight appear normal. Mood & affect appropriate.     Data Reviewed: I have personally reviewed following labs and imaging studies  CBC:  Recent Labs Lab 09/21/15 2151 09/22/15 0509 09/22/15 1000  WBC 8.2 9.3 7.1  NEUTROABS 4.7 4.7  --   HGB 13.7 12.6* 13.0  HCT 38.7* 36.3* 37.7*  MCV 88.8 91.7 91.7  PLT 259 237 236   Basic Metabolic Panel:  Recent Labs Lab 09/21/15 2151 09/21/15 2332 09/22/15 0509  NA 137  --  140  K 4.0  --  4.5  CL 104  --  110  CO2 26  --  26  GLUCOSE 108*  --  104*  BUN 23*  --  18  CREATININE 1.22  --  1.22  CALCIUM 9.0  --  8.2*  MG  --  2.0  --   PHOS  --  3.4  --    GFR: Estimated Creatinine Clearance: 96.2 mL/min (by C-G formula based on Cr of 1.22). Liver Function Tests:  Recent Labs Lab 09/21/15 2151 09/22/15 0509  AST 21 16  ALT 16* 14*  ALKPHOS 39 35*  BILITOT 0.3 0.6  PROT 6.7 5.9*  ALBUMIN 4.0 3.5    Recent Labs Lab 09/21/15 2151  LIPASE 20   No results for input(s): AMMONIA in the last 168 hours. Coagulation Profile:  Recent Labs Lab 09/21/15 2332 09/22/15 0509  INR 1.06 1.14   Cardiac Enzymes:  Recent Labs Lab 09/22/15 0509  TROPONINI <0.03   BNP (last 3 results) No results for input(s): PROBNP in the last 8760  hours. HbA1C: No results for input(s): HGBA1C in the last 72 hours. CBG: No results for input(s): GLUCAP in the last 168 hours. Lipid Profile: No results for input(s): CHOL, HDL, LDLCALC, TRIG, CHOLHDL, LDLDIRECT in the last 72 hours. Thyroid Function Tests: No results for input(s): TSH, T4TOTAL, FREET4, T3FREE, THYROIDAB in the last 72 hours. Anemia Panel: No results for input(s): VITAMINB12, FOLATE, FERRITIN, TIBC, IRON, RETICCTPCT in the last 72 hours. Urine analysis:    Component Value Date/Time   COLORURINE YELLOW 09/21/2015 2321   APPEARANCEUR CLEAR 09/21/2015 2321   LABSPEC 1.025 09/21/2015 2321   PHURINE 7.0 09/21/2015 2321   GLUCOSEU NEGATIVE 09/21/2015 2321   HGBUR NEGATIVE 09/21/2015 2321   BILIRUBINUR NEGATIVE 09/21/2015 2321   KETONESUR NEGATIVE 09/21/2015 2321   PROTEINUR NEGATIVE 09/21/2015 2321   NITRITE NEGATIVE 09/21/2015 2321   LEUKOCYTESUR NEGATIVE 09/21/2015 2321        Radiology Studies: Dg Chest 2 View  09/21/2015  CLINICAL DATA:  Shortness of  breath EXAM: CHEST  2 VIEW COMPARISON:  None. FINDINGS: The heart size and mediastinal contours are within normal limits. Both lungs are clear. The visualized skeletal structures are unremarkable. IMPRESSION: No active cardiopulmonary disease. Electronically Signed   By: Marnee Spring M.D.   On: 09/21/2015 22:05        Scheduled Meds: . ciprofloxacin-hydrocortisone  3 drop Right Ear BID  . nicotine  21 mg Transdermal Daily  . sodium chloride flush  3 mL Intravenous Q12H   Continuous Infusions: . sodium chloride 125 mL/hr at 09/22/15 0752  . pantoprozole (PROTONIX) infusion 8 mg/hr (09/22/15 0752)     LOS: 1 day    Time spent: 40 minutes.    [facility], MD [facility] Pager [phone]  If 7PM-7AM, please contact night-coverage [email or web address] Password [email or web address] 09/22/2015, 10:50 AM

## 2015-09-22 NOTE — Hospital Discharge Follow-Up (Signed)
Message received from Lanier ClamKathy Mahabir, RN CM requesting a hospital follow up appointment for the [facility]. There are no appointments available at this time. Will check again tomorrow.   Update provided to K. Mahabir, RN CM

## 2015-09-22 NOTE — Progress Notes (Signed)
  Echocardiogram 2D Echocardiogram has been performed.  Caleb Mcclure 09/22/2015, 11:06 AM

## 2015-09-23 ENCOUNTER — Encounter (HOSPITAL_COMMUNITY): Payer: Self-pay

## 2015-09-23 ENCOUNTER — Encounter (HOSPITAL_COMMUNITY)
Admission: EM | Disposition: A | Discharge status: Home / Self Care | Payer: Self-pay | Source: Home / Self Care | Attending: Internal Medicine

## 2015-09-23 ENCOUNTER — Inpatient Hospital Stay (HOSPITAL_COMMUNITY): Payer: MEDICAID | Admitting: Certified Registered"

## 2015-09-23 DIAGNOSIS — Z7289 Other problems related to lifestyle: Secondary | ICD-10-CM | POA: Diagnosis present

## 2015-09-23 DIAGNOSIS — R066 Hiccough: Secondary | ICD-10-CM | POA: Diagnosis present

## 2015-09-23 DIAGNOSIS — H9209 Otalgia, unspecified ear: Secondary | ICD-10-CM | POA: Diagnosis present

## 2015-09-23 DIAGNOSIS — Z789 Other specified health status: Secondary | ICD-10-CM | POA: Diagnosis present

## 2015-09-23 HISTORY — PX: ESOPHAGOGASTRODUODENOSCOPY (EGD) WITH PROPOFOL: SHX5813

## 2015-09-23 LAB — CBC
HCT: 34.6 % — ABNORMAL LOW (ref 39.0–52.0)
HEMOGLOBIN: 12.1 g/dL — AB (ref 13.0–17.0)
MCH: 31.3 pg (ref 26.0–34.0)
MCHC: 35 g/dL (ref 30.0–36.0)
MCV: 89.6 fL (ref 78.0–100.0)
Platelets: 214 10*3/uL (ref 150–400)
RBC: 3.86 MIL/uL — AB (ref 4.22–5.81)
RDW: 13.2 % (ref 11.5–15.5)
WBC: 4.9 10*3/uL (ref 4.0–10.5)

## 2015-09-23 SURGERY — ESOPHAGOGASTRODUODENOSCOPY (EGD) WITH PROPOFOL
Anesthesia: Monitor Anesthesia Care | Laterality: Left

## 2015-09-23 MED ORDER — LACTATED RINGERS IV SOLN
INTRAVENOUS | Status: DC | PRN
  Administered 2015-09-23: 13:00:00 via INTRAVENOUS

## 2015-09-23 MED ORDER — ONDANSETRON HCL 4 MG/2ML IJ SOLN
INTRAMUSCULAR | Status: AC
  Filled 2015-09-23: qty 2

## 2015-09-23 MED ORDER — LIDOCAINE HCL (CARDIAC) 20 MG/ML IV SOLN
INTRAVENOUS | Status: DC | PRN
  Administered 2015-09-23: 80 mg via INTRAVENOUS
  Administered 2015-09-23: 20 mg via INTRAVENOUS

## 2015-09-23 MED ORDER — PANTOPRAZOLE SODIUM 40 MG PO TBEC
40.0000 mg | DELAYED_RELEASE_TABLET | Freq: Two times a day (BID) | ORAL | Status: DC
  Administered 2015-09-24 – 2015-09-25 (×3): 40 mg via ORAL
  Filled 2015-09-23 (×6): qty 1

## 2015-09-23 MED ORDER — SODIUM CHLORIDE 0.9 % IV SOLN
INTRAVENOUS | Status: AC
  Administered 2015-09-23: 18:00:00 via INTRAVENOUS

## 2015-09-23 MED ORDER — LIDOCAINE HCL (CARDIAC) 20 MG/ML IV SOLN
INTRAVENOUS | Status: AC
  Filled 2015-09-23: qty 5

## 2015-09-23 MED ORDER — PROPOFOL 500 MG/50ML IV EMUL
INTRAVENOUS | Status: DC | PRN
  Administered 2015-09-23: 250 ug/kg/min via INTRAVENOUS

## 2015-09-23 MED ORDER — ONDANSETRON HCL 4 MG/2ML IJ SOLN
INTRAMUSCULAR | Status: DC | PRN
  Administered 2015-09-23 (×2): 2 mg via INTRAVENOUS

## 2015-09-23 MED ORDER — SODIUM CHLORIDE 0.9 % IV SOLN: INTRAVENOUS | Status: DC

## 2015-09-23 MED ORDER — PROPOFOL 10 MG/ML IV BOLUS
INTRAVENOUS | Status: AC
  Filled 2015-09-23: qty 40

## 2015-09-23 MED ORDER — PROPOFOL 10 MG/ML IV BOLUS
INTRAVENOUS | Status: DC | PRN
  Administered 2015-09-23: 30 mg via INTRAVENOUS
  Administered 2015-09-23: 50 mg via INTRAVENOUS
  Administered 2015-09-23: 60 mg via INTRAVENOUS

## 2015-09-23 SURGICAL SUPPLY — 14 items

## 2015-09-23 NOTE — H&P (View-Only) (Signed)
[facility]  Referring Provider: Dr. Marcellus ScottAnand Hongalgi [facility]) Primary Care Physician:  No primary care provider on file.  Reason for Consultation:  Coffee ground emesis; dark stools  HPI: Caleb Mcclure is a 45 y.o. male whom we've been asked to see for one week history of hiccoughs, and several day history of coffee ground emesis and dark stools.  No abdominal pain.  No bright red hematemesis or hematochezia.  No prior similar symptoms.  No NSAIDs.  Recent trip to [place], where he ate lots of seafood throughout and drank alcohol for one day.   History reviewed. No pertinent past medical history.  History reviewed. No pertinent past surgical history.  Prior to Admission medications   Not on File    Current Facility-Administered Medications  Medication Dose Route Frequency Provider Last Rate Last Dose  . 0.9 %  sodium chloride infusion   Intravenous Continuous Elease EtienneAnand D Hongalgi, MD 100 mL/hr at 09/22/15 1130    . chlorproMAZINE (THORAZINE) 12.5 mg in sodium chloride 0.9 % 25 mL IVPB  12.5 mg Intravenous Q6H PRN Elease EtienneAnand D Hongalgi, MD   12.5 mg at 09/22/15 1120  . ciprofloxacin-hydrocortisone (CIPRO HC OTIC) 0.2-1 % otic suspension 3 drop  3 drop Right Ear BID Bobette Moavid Manuel Ortiz, MD   3 drop at 09/22/15 0926  . HYDROmorphone (DILAUDID) injection 1 mg  1 mg Intravenous Q6H PRN Bobette Moavid Manuel Ortiz, MD   1 mg at 09/22/15 1117  . menthol-cetylpyridinium (CEPACOL) lozenge 3 mg  1 lozenge Oral PRN Elease EtienneAnand D Hongalgi, MD      . nicotine (NICODERM CQ - dosed in mg/24 hours) patch 21 mg  21 mg Transdermal Daily Bobette Moavid Manuel Ortiz, MD   21 mg at 09/22/15 0919  . ondansetron (ZOFRAN) injection 4 mg  4 mg Intravenous Q6H PRN Bobette Moavid Manuel Ortiz, MD   4 mg at 09/22/15 0057  . pantoprazole (PROTONIX) 80 mg in sodium chloride 0.9 % 250 mL (0.32 mg/mL) infusion  8 mg/hr Intravenous Continuous Loren Raceravid Yelverton, MD 25 mL/hr at 09/22/15 0752 8 mg/hr at 09/22/15 0752  . sodium chloride flush (NS)  0.9 % injection 3 mL  3 mL Intravenous Q12H Bobette Moavid Manuel Ortiz, MD   3 mL at 09/22/15 0139  . sucralfate (CARAFATE) 1 GM/10ML suspension 1 g  1 g Oral TID WC & HS Elease EtienneAnand D Hongalgi, MD   1 g at 09/22/15 1204    Allergies as of 09/21/2015  . (No Known Allergies)    History reviewed. No pertinent family history.  Social History   Social History  . Marital Status: Single    Spouse Name: N/A  . Number of Children: N/A  . Years of Education: N/A   Occupational History  . Not on file.   Social History Main Topics  . Smoking status: Current Every Day Smoker  . Smokeless tobacco: Not on file  . Alcohol Use: Yes  . Drug Use: No  . Sexual Activity: Not on file   Other Topics Concern  . Not on file   Social History Narrative  . No narrative on file    Review of Systems: As per HPI, all others negative  Physical Exam: Vital signs in last 24 hours: Temp:  [97.7 F (36.5 C)-98.7 F (37.1 C)] 97.7 F (36.5 C) (04/24 1230) Pulse Rate:  [69-94] 69 (04/24 1230) Resp:  [16-22] 16 (04/24 1230) BP: (112-143)/(50-84) 113/50 mmHg (04/24 1230) SpO2:  [98 %-100 %] 98 % (04/24 1230) Weight:  [100.109 kg (  220 lb 11.2 oz)] 100.109 kg (220 lb 11.2 oz) (04/24 0029) Last BM Date: 09/21/15 General:   Alert,  Well-developed, well-nourished, pleasant and cooperative in NAD; visibly has multiple hiccoughs during my exam session Head:  Normocephalic and atraumatic. Eyes:  Sclera clear, no icterus.   Conjunctiva pink. Ears:  Normal auditory acuity. Nose:  No deformity, discharge,  or lesions. Mouth:  No deformity or lesions.  Oropharynx pink but dry Neck:  Supple; no masses or thyromegaly. Lungs:  Clear throughout to auscultation.   No wheezes, crackles, or rhonchi. No acute distress. Heart:  Regular rate and rhythm; no murmurs, clicks, rubs,  or gallops. Abdomen:  Soft, nontender and nondistended. No masses, hepatosplenomegaly or hernias noted. Normal bowel sounds, without guarding, and without  rebound.     Msk:  Symmetrical without gross deformities. Normal posture. Pulses:  Normal pulses noted. Extremities:  Without clubbing or edema. Neurologic:  Alert and  oriented x4;  grossly normal neurologically. Skin:  Intact without significant lesions or rashes. Psych:  Alert and cooperative. Normal mood and affect.   Lab Results:  Recent Labs  09/21/15 2151 09/22/15 0509 09/22/15 1000  WBC 8.2 9.3 7.1  HGB 13.7 12.6* 13.0  HCT 38.7* 36.3* 37.7*  PLT 259 237 236   BMET  Recent Labs  09/21/15 2151 09/22/15 0509  NA 137 140  K 4.0 4.5  CL 104 110  CO2 26 26  GLUCOSE 108* 104*  BUN 23* 18  CREATININE 1.22 1.22  CALCIUM 9.0 8.2*   LFT  Recent Labs  09/22/15 0509  PROT 5.9*  ALBUMIN 3.5  AST 16  ALT 14*  ALKPHOS 35*  BILITOT 0.6   PT/INR  Recent Labs  09/21/15 2332 09/22/15 0509  LABPROT 13.6 14.3  INR 1.06 1.14    Studies/Results: Dg Chest 2 View  09/21/2015  CLINICAL DATA:  Shortness of breath EXAM: CHEST  2 VIEW COMPARISON:  None. FINDINGS: The heart size and mediastinal contours are within normal limits. Both lungs are clear. The visualized skeletal structures are unremarkable. IMPRESSION: No active cardiopulmonary disease. Electronically Signed   By: Marnee Spring M.D.   On: 09/21/2015 22:05   Impression:  1.  Hiccoughs. 2.  Chest discomfort.  Cardiac work-up thus far unrevealing, and symptoms not typical of cardiac etiology. 3.  Coffee ground emesis. 4.  Dark stools.  Plan:  1.  Full liquid diet tonight. 2.  NPO after midnight. 3.  Thorazine administration for hiccoughs in progress. 4.  PPI + sucralfate. 5.  Endoscopy tomorrow ~ 12:30 pm. 6.  Risks (bleeding, infection, bowel perforation that could require surgery, sedation-related changes in cardiopulmonary systems), benefits (identification and possible treatment of source of symptoms, exclusion of certain causes of symptoms), and alternatives (watchful waiting, radiographic imaging  studies, empiric medical treatment) of upper endoscopy (EGD) were explained to patient/family in detail and patient wishes to proceed.   LOS: 1 day   Freddy Jaksch  09/22/2015, 2:11 PM  Pager [phone] If no answer or after 5 PM call [phone]

## 2015-09-23 NOTE — Transfer of Care (Signed)
Immediate Anesthesia Transfer of Care Note  Patient: Caleb Mcclure  Procedure(s) Performed: Procedure(s): ESOPHAGOGASTRODUODENOSCOPY (EGD) WITH PROPOFOL (Left)  Patient Location: PACU  Anesthesia Type:MAC  Level of Consciousness:  sedated, patient cooperative and responds to stimulation  Airway & Oxygen Therapy:Patient Spontanous Breathing and Patient connected to nasal oxgen  Post-op Assessment:  Report given to PACU RN and Post -op Vital signs reviewed and stable  Post vital signs:  Reviewed and stable  Last Vitals:  Filed Vitals:   09/23/15 0459 09/23/15 1306  BP: 112/56 157/95  Pulse: 68 116  Temp: 36.6 C 36.9 C  Resp: 16 17    Complications: No apparent anesthesia complications

## 2015-09-23 NOTE — Op Note (Signed)
[facility] Patient Name: Caleb Mcclure Procedure Date: 09/23/2015 MRN: [id number] Attending MD: Willis Modena , MD Date of Birth: 28-Nov-1970 CSN:  Age: 45 Admit Type: Inpatient Procedure:                Upper GI endoscopy Indications:              Coffee-ground emesis, Melena, Chest pain (non                            cardiac) Providers:                Willis Modena, MD, Dow Adolph, RN, Clearnce Sorrel,                            Technician, Wandra Scot Referring MD:              Medicines:                Propofol per Anesthesia Complications:            No immediate complications. Estimated Blood Loss:     Estimated blood loss: none. Procedure:                Pre-Anesthesia Assessment:                           - Prior to the procedure, a History and Physical                            was performed, and patient medications and                            allergies were reviewed. The patient's tolerance of                            previous anesthesia was also reviewed. The risks                            and benefits of the procedure and the sedation                            options and risks were discussed with the patient.                            All questions were answered, and informed consent                            was obtained. Prior Anticoagulants: The patient has                            taken no previous anticoagulant or antiplatelet                            agents. ASA Grade Assessment: II - A patient with                            mild  systemic disease. After reviewing the risks                            and benefits, the patient was deemed in                            satisfactory condition to undergo the procedure.                           After obtaining informed consent, the endoscope was                            passed under direct vision. Throughout the                            procedure, the patient's blood pressure,  pulse, and                            oxygen saturations were monitored continuously. The                            EG-[id number] ([id number]) scope was introduced through the                            mouth, and advanced to the second part of duodenum.                            The upper GI endoscopy was accomplished without                            difficulty. The patient tolerated the procedure                            well. Scope In: Scope Out: Findings:      A small hiatal hernia was present.      LA Grade C (one or more mucosal breaks continuous between tops of 2 or       more mucosal folds, less than 75% circumference) esophagitis was found.      The exam of the esophagus was otherwise normal.      The entire examined stomach was normal.      The duodenal bulb, first portion of the duodenum and second portion of       the duodenum were normal. Impression:               - Small hiatal hernia.                           - LA Grade C reflux esophagitis, which could                            account for coffee-ground emesis, chest pain and                            hiccoughs.                           -  Normal stomach.                           - Normal duodenal bulb, first portion of the                            duodenum and second portion of the duodenum.                           - No specimens collected. Moderate Sedation:      N/A- Per Anesthesia Care Recommendation:           - Return patient to hospital ward for ongoing care.                           - Continue present medications, including PPI and                            sucralfate                           - Thorazine or metoclopramide are options for                            hiccoughs, which unfortunately will likely fester                            until his esophagitis has improved.                           [facility] will follow. Patient likely will need                            repeat endoscopy in 3  months to assess for                            esophagitis healing.                           - Clear liquid diet today. Procedure Code(s):        --- Professional ---                           [id number], Esophagogastroduodenoscopy, flexible,                            transoral; diagnostic, including collection of                            specimen(s) by brushing or washing, when performed                            (separate procedure) Diagnosis Code(s):        --- Professional ---                           K44.9, Diaphragmatic hernia without obstruction  or                            gangrene                           K21.0, Gastro-esophageal reflux disease with                            esophagitis                           K92.0, Hematemesis                           K92.1, Melena (includes Hematochezia)                           R07.89, Other chest pain CPT copyright 2016 [employer]. All rights reserved. The codes documented in this report are preliminary and upon coder review may  be revised to meet current compliance requirements. Willis Modena, MD 09/23/2015 1:12:50 PM This report has been signed electronically. Number of Addenda: 0

## 2015-09-23 NOTE — Anesthesia Postprocedure Evaluation (Signed)
Anesthesia Post Note  Patient: Caleb Mcclure  Procedure(s) Performed: Procedure(s) (LRB): ESOPHAGOGASTRODUODENOSCOPY (EGD) WITH PROPOFOL (Left)  Patient location during evaluation: PACU Anesthesia Type: MAC Level of consciousness: awake and alert Pain management: pain level controlled Vital Signs Assessment: post-procedure vital signs reviewed and stable Respiratory status: spontaneous breathing, nonlabored ventilation, respiratory function stable and patient connected to nasal cannula oxygen Cardiovascular status: stable and blood pressure returned to baseline Anesthetic complications: no    Last Vitals:  Filed Vitals:   09/23/15 1330 09/23/15 1434  BP: 144/87 128/68  Pulse: 87 70  Temp:  36.9 C  Resp: 18 20    Last Pain:  Filed Vitals:   09/23/15 1435  PainSc: 8                  Ryane Canavan J

## 2015-09-23 NOTE — Progress Notes (Signed)
PROGRESS NOTE  Caleb Mcclure  YNW:[id number] DOB: 12-16-1970  DOA: 09/21/2015 PCP: No primary care provider on file.  Outpatient Specialists:  None  Brief Narrative:  45 year old male with no known PMH, ongoing tobacco abuse, alcohol use on weekends, physically very active (swims 60 labs per day up to 5 days per week and does weights), presented to the ED on 4/23 with 6 days history of ongoing hiccups, retrosternal spasm/discomfort, intermittent vomiting with coffee-grounds & couple of episodes of black tarry stools. Patient in usual state of health until 6 days ago when he started having hiccups followed by retrosternal discomfort/cramping. Next day he traveled to [place] on vacation. Symptoms progressively worsened, drank alcohol for the first day during vacation, had couple episodes of coffee-ground emesis, did do scuba diving after taking some Dramamine, seen in ED there for worsening symptoms, had 2 episodes of black tarry stools prior to starting Pepto-Bismol-advised hospitalization in [place] but he declined, was given Valium, flew back to [facility] 4/23. On landing in [place], noted pain in years without hearing loss, had couple more episodes of vomiting and presented to the ED. Admitted for acute upper GI bleed and atypical chest pain-likely GI related. [facility] consulted and plan EGD 4/25.   Assessment & Plan:   Principal Problem:   UGI bleed Active Problems:   Tobacco abuse disorder   Atypical chest pain   Hiccups   Alcohol use (HCC)   Earache   Acute upper GI bleed - DD: Acute esophagitis, gastritis, PUD, MW tear versus other etiologies. - Treating supportively with bowel rest, IV fluids, IV PPI infusion. Added Sucralfate. - [facility] consultation appreciated and plan EGD 4/25. - Hemodynamically stable. Minimal drop in hemoglobin suggest mild bleeding. - No further emesis or BM since hospitalization. - Further plans per GI recommendations.  Hiccups - May be related to  above. Treating with IV PPI, oral sucralfate and when necessary IV chlorpromazine. Slightly better. Per GI.  Atypical chest pain - Most likely GI in origin. - Patient physically quite active. No prior PMH or family history of heart disease. - EKG: Sinus rhythm without acute changes. I do not appreciate ST segment elevation. Normal QTC. 2 troponins thus far negative. Telemetry: Sinus rhythm without arrhythmias. Chest x-ray without acute findings. - 2-D echo: Unremarkable - Cardiology consultation appreciated: Noncardiac chest pain. No further cardiac workup recommended. Cardiology signed off 4/24.  Tobacco abuse - Cessation counseled. Continue nicotine patch.  Alcohol use/? Abuse - Moderation counseled. States that he drinks a couple of vodka drinks on Fridays and Saturdays.  Bilateral earache - Occurred after landing after flight journey from [place]. Right earache has resolved. Left earache better. No hearing loss or discharge. May be related to their cabin pressure/barotrauma. Monitor. If does not improve or worsens, outpatient ENT consultation. - Improved.  Throat pain - Examined 4/24: Mild patchy redness without exudates. No other acute findings.? Viral pharyngitis versus related to GERD. Cepacol lozenges.  DVT prophylaxis: SCD's Code Status: Full Family Communication: Discussed with patient. No family at bedside.  Disposition Plan: DC home when medically stable, possibly in the next 1-2 days.    Consultants:   [facility]  Cardiology-signed off  Procedures:   2-D echo 09/22/15: Study Conclusions  - Procedure narrative: Transthoracic echocardiography. Image  quality was adequate. The study was technically difficult due to  patient hiccups. - Left ventricle: The cavity size was normal. Wall thickness was  normal. Systolic function was normal. The estimated ejection  fraction was in the range  of 55% to 60%. Wall motion was normal;  there were no regional wall motion  abnormalities.  Impressions:  - Normal LV systolic function; probable mild diastolic dysfunction;  trace TR.  Antimicrobials:   None    Subjective: Hiccups slightly better. Some throat pain. No chest pain or earache reported. No nausea, vomiting or BM since admission.  Objective:  Filed Vitals:   09/22/15 0516 09/22/15 1230 09/22/15 2107 09/23/15 0459  BP: 112/59 113/50 130/68 112/56  Pulse: 72 69 69 68  Temp: 98 F (36.7 C) 97.7 F (36.5 C) 98.2 F (36.8 C) 97.8 F (36.6 C)  TempSrc: Oral Oral Oral Oral  Resp: Height:      Weight:      SpO2: 100% 98% 100% 100%    Intake/Output Summary (Last 24 hours) at 09/23/15 0719 Last data filed at 09/23/15 0300  Gross per 24 hour  Intake 3065.83 ml  Output      0 ml  Net 3065.83 ml   Filed Weights   09/22/15 0029  Weight: 100.109 kg (220 lb 11.2 oz)    Examination:  General exam: Pleasant young male, moderately built and nourished, lying comfortably supine in bed. Appears calm and comfortable  Respiratory system: Clear to auscultation. Respiratory effort normal. Cardiovascular system: S1 & S2 heard, RRR.Marland Kitchen No JVD, murmurs, rubs, gallops or clicks. No pedal edema. Telemetry: SR. Occasional mild sinus tachycardia. Gastrointestinal system: Abdomen is nondistended, soft. Mild epigastric tenderness without peritoneal signs. No organomegaly or masses felt. Normal bowel sounds heard. Central nervous system: Alert and oriented. No focal neurological deficits. Extremities: Symmetric 5 x 5 power. Skin: No rashes, lesions or ulcers Psychiatry: Judgement and insight appear normal. Mood & affect appropriate.     Data Reviewed: I have personally reviewed following labs and imaging studies  CBC:  Recent Labs Lab 09/21/15 2151 09/22/15 0509 09/22/15 1000 09/23/15 0544  WBC 8.2 9.3 7.1 4.9  NEUTROABS 4.7 4.7  --   --   HGB 13.7 12.6* 13.0 12.1*  HCT 38.7* 36.3* 37.7* 34.6*  MCV 88.8 91.7 91.7 89.6  PLT 259  237 236 214   Basic Metabolic Panel:  Recent Labs Lab 09/21/15 2151 09/21/15 2332 09/22/15 0509  NA 137  --  140  K 4.0  --  4.5  CL 104  --  110  CO2 26  --  26  GLUCOSE 108*  --  104*  BUN 23*  --  18  CREATININE 1.22  --  1.22  CALCIUM 9.0  --  8.2*  MG  --  2.0  --   PHOS  --  3.4  --    GFR: Estimated Creatinine Clearance: 96.2 mL/min (by C-G formula based on Cr of 1.22). Liver Function Tests:  Recent Labs Lab 09/21/15 2151 09/22/15 0509  AST 21 16  ALT 16* 14*  ALKPHOS 39 35*  BILITOT 0.3 0.6  PROT 6.7 5.9*  ALBUMIN 4.0 3.5    Recent Labs Lab 09/21/15 2151  LIPASE 20   No results for input(s): AMMONIA in the last 168 hours. Coagulation Profile:  Recent Labs Lab 09/21/15 2332 09/22/15 0509  INR 1.06 1.14   Cardiac Enzymes:  Recent Labs Lab 09/22/15 0509 09/22/15 1000  TROPONINI <0.03 <0.03   BNP (last 3 results) No results for input(s): PROBNP in the last 8760 hours. HbA1C: No results for input(s): HGBA1C in the last 72 hours. CBG: No results for input(s): GLUCAP in the last 168 hours. Lipid  Profile:  Recent Labs  09/22/15 1000  CHOL 131  HDL 53  LDLCALC 63  TRIG 75  CHOLHDL 2.5   Thyroid Function Tests: No results for input(s): TSH, T4TOTAL, FREET4, T3FREE, THYROIDAB in the last 72 hours. Anemia Panel: No results for input(s): VITAMINB12, FOLATE, FERRITIN, TIBC, IRON, RETICCTPCT in the last 72 hours. Urine analysis:    Component Value Date/Time   COLORURINE YELLOW 09/21/2015 2321   APPEARANCEUR CLEAR 09/21/2015 2321   LABSPEC 1.025 09/21/2015 2321   PHURINE 7.0 09/21/2015 2321   GLUCOSEU NEGATIVE 09/21/2015 2321   HGBUR NEGATIVE 09/21/2015 2321   BILIRUBINUR NEGATIVE 09/21/2015 2321   KETONESUR NEGATIVE 09/21/2015 2321   PROTEINUR NEGATIVE 09/21/2015 2321   NITRITE NEGATIVE 09/21/2015 2321   LEUKOCYTESUR NEGATIVE 09/21/2015 2321        Radiology Studies: Dg Chest 2 View  09/21/2015  CLINICAL DATA:  Shortness of  breath EXAM: CHEST  2 VIEW COMPARISON:  None. FINDINGS: The heart size and mediastinal contours are within normal limits. Both lungs are clear. The visualized skeletal structures are unremarkable. IMPRESSION: No active cardiopulmonary disease. Electronically Signed   By: Marnee SpringJonathon  Watts M.D.   On: 09/21/2015 22:05        Scheduled Meds: . ciprofloxacin-hydrocortisone  3 drop Right Ear BID  . nicotine  21 mg Transdermal Daily  . sodium chloride flush  3 mL Intravenous Q12H  . sucralfate  1 g Oral TID WC & HS   Continuous Infusions: . sodium chloride 100 mL/hr at 09/22/15 2346  . pantoprozole (PROTONIX) infusion 8 mg/hr (09/23/15 0501)     LOS: 2 days    Time spent: 20 minutes.    Mesquite Rehabilitation HospitalNGALGI,ANAND, MD [facility] Pager [phone]  If 7PM-7AM, please contact night-coverage [email or web address] Password [email or web address] 09/23/2015, 7:19 AM

## 2015-09-23 NOTE — Hospital Discharge Follow-Up (Signed)
[facility] are still [facility].  The patient can be advised to call the clinic to schedule an appointment after discharge.   Update provided to Lanier ClamKathy Mahabir, RN CM.

## 2015-09-23 NOTE — Interval H&P Note (Signed)
History and Physical Interval Note:  09/23/2015 12:47 PM  Caleb Mcclure  has presented today for surgery, with the diagnosis of abdominal pain, melena  The various methods of treatment have been discussed with the patient and family. After consideration of risks, benefits and other options for treatment, the patient has consented to  Procedure(s): ESOPHAGOGASTRODUODENOSCOPY (EGD) WITH PROPOFOL (Left) as a surgical intervention .  The patient's history has been reviewed, patient examined, no change in status, stable for surgery.  I have reviewed the patient's chart and labs.  Questions were answered to the patient's satisfaction.     Freddy JakschUTLAW,Virtie Bungert M

## 2015-09-23 NOTE — Anesthesia Preprocedure Evaluation (Addendum)
Anesthesia Evaluation  Patient identified by MRN, date of birth, ID band Patient awake    Reviewed: Allergy & Precautions, NPO status , Patient's Chart, lab work & pertinent test results  Airway Mallampati: II  TM Distance: >3 FB Neck ROM: Full    Dental no notable dental hx.    Pulmonary Current Smoker,    Pulmonary exam normal breath sounds clear to auscultation       Cardiovascular negative cardio ROS Normal cardiovascular exam Rhythm:Regular Rate:Normal  ECHO 09-22-15: Study Conclusions  - Procedure narrative: Transthoracic echocardiography. Image  quality was adequate. The study was technically difficult due to  patient hiccups. - Left ventricle: The cavity size was normal. Wall thickness was  normal. Systolic function was normal. The estimated ejection  fraction was in the range of 55% to 60%. Wall motion was normal;  there were no regional wall motion abnormalities.  Impressions:  - Normal LV systolic function; probable mild diastolic dysfunction;  trace TR.   Neuro/Psych negative neurological ROS  negative psych ROS   GI/Hepatic negative GI ROS, Neg liver ROS,   Endo/Other  negative endocrine ROS  Renal/GU negative Renal ROS  negative genitourinary   Musculoskeletal negative musculoskeletal ROS (+)   Abdominal   Peds negative pediatric ROS (+)  Hematology negative hematology ROS (+)   Anesthesia Other Findings   Reproductive/Obstetrics negative OB ROS                            Anesthesia Physical Anesthesia Plan  ASA: II  Anesthesia Plan: MAC   Post-op Pain Management:    Induction: Intravenous  Airway Management Planned: Natural Airway  Additional Equipment:   Intra-op Plan:   Post-operative Plan:   Informed Consent: I have reviewed the patients History and Physical, chart, labs and discussed the procedure including the risks, benefits and  alternatives for the proposed anesthesia with the patient or authorized representative who has indicated his/her understanding and acceptance.   Dental advisory given  Plan Discussed with: CRNA  Anesthesia Plan Comments:         Anesthesia Quick Evaluation

## 2015-09-24 ENCOUNTER — Encounter (HOSPITAL_COMMUNITY): Payer: Self-pay | Admitting: Gastroenterology

## 2015-09-24 DIAGNOSIS — Z789 Other specified health status: Secondary | ICD-10-CM

## 2015-09-24 DIAGNOSIS — R066 Hiccough: Secondary | ICD-10-CM

## 2015-09-24 DIAGNOSIS — K209 Esophagitis, unspecified without bleeding: Secondary | ICD-10-CM

## 2015-09-24 LAB — CBC
HEMATOCRIT: 35 % — AB (ref 39.0–52.0)
HEMOGLOBIN: 12.1 g/dL — AB (ref 13.0–17.0)
MCH: 31.6 pg (ref 26.0–34.0)
MCHC: 34.6 g/dL (ref 30.0–36.0)
MCV: 91.4 fL (ref 78.0–100.0)
Platelets: 235 10*3/uL (ref 150–400)
RBC: 3.83 MIL/uL — AB (ref 4.22–5.81)
RDW: 13.2 % (ref 11.5–15.5)
WBC: 5.3 10*3/uL (ref 4.0–10.5)

## 2015-09-24 MED ORDER — METOCLOPRAMIDE HCL 5 MG/ML IJ SOLN
5.0000 mg | Freq: Three times a day (TID) | INTRAMUSCULAR | Status: DC
  Administered 2015-09-24 – 2015-09-25 (×4): 5 mg via INTRAVENOUS
  Filled 2015-09-24 (×3): qty 1
  Filled 2015-09-24 (×2): qty 2
  Filled 2015-09-24: qty 1

## 2015-09-24 MED ORDER — CHLORPROMAZINE HCL 25 MG/ML IJ SOLN: 25.0000 mg | Freq: Once | INTRAMUSCULAR | Status: DC

## 2015-09-24 MED ORDER — CHLORPROMAZINE HCL 25 MG/ML IJ SOLN
25.0000 mg | Freq: Once | INTRAMUSCULAR | Status: AC
  Administered 2015-09-24: 25 mg via INTRAMUSCULAR
  Filled 2015-09-24: qty 1

## 2015-09-24 NOTE — Progress Notes (Signed)
[facility] PROGRESS NOTE    Progress Note  Caleb Mcclure  ZOX:[id number] DOB: Nov 10, 1970 DOA: 09/21/2015 PCP: No primary care provider on file.  Outpatient Specialists:    Brief Narrative:   Caleb Mcclure is an 45 y.o. male past medical history ongoing tobacco use presents to the ED with a six-day history of hiccups and retrosternal spasms, EGD performed on 09/23/2015 showed a small hiatal hernia and reflux esophagitis  Assessment/Plan:   Acute coffee-ground emesis due to Acute esophagitis/Hiccups: Status post EGD that showed esophagitis no ulcers performed on 09/23/2015. We'll continue PPI twice a day, I agree with advancing his diet to soft.  Her hemoglobin has remained stable.  Hiccups and atypical chest pain were likely related to her esophagitis.   Tobacco abuse disorder: Counseling.  Alcohol use (HCC) No signs of withdrawal.    Earache Resolved a be related to Pressure.   DVT prophylaxis: heparin order Family Communication:none Disposition Plan: home in am Code Status:     Code Status Orders        Start     Ordered   09/22/15 0037  Full code   Continuous     09/22/15 0036    Code Status History    Date Active Date Inactive Code Status Order ID Comments User Context   This patient has a current code status but no historical code status.        IV Access:    Peripheral IV   Procedures and diagnostic studies:   No results found.   Medical Consultants:    None.  Anti-Infectives:   none  Subjective:    Caleb Mcclure still having headache tolerating diet well.  Objective:    Filed Vitals:   09/23/15 1330 09/23/15 1434 09/23/15 2053 09/24/15 0528  BP: 144/87 128/68 133/80 127/55  Pulse: 87 70 69 69  Temp:  98.4 F (36.9 C) 98.7 F (37.1 C) 97.8 F (36.6 C)  TempSrc:  Other (Comment) Oral Oral  Resp: 18 20 22 19   Height:      Weight:      SpO2: 100% 100% 100% 97%    Intake/Output Summary (Last 24 hours) at 09/24/15  0954 Last data filed at 09/24/15 0300  Gross per 24 hour  Intake   1978 ml  Output      3 ml  Net   1975 ml   Filed Weights   09/22/15 0029  Weight: 100.109 kg (220 lb 11.2 oz)    Exam: General exam: In no acute distress. Respiratory system: Good air movement and clear to auscultation. Cardiovascular system: S1 & S2 heard, RRR. No JVD, murmurs, rubs, gallops or clicks. No pedal edema. Gastrointestinal system: Abdomen is nondistended, soft and nontender. No organomegaly or masses felt. Normal bowel sounds heard. Central nervous system: Alert and oriented. No focal neurological deficits. Extremities:no edema. Skin: No rashes, lesions or ulcers Psychiatry: Judgement and insight appear normal. Mood & affect appropriate.    Data Reviewed:    Labs: Basic Metabolic Panel:  Recent Labs Lab 09/21/15 2151 09/21/15 2332 09/22/15 0509  NA 137  --  140  K 4.0  --  4.5  CL 104  --  110  CO2 26  --  26  GLUCOSE 108*  --  104*  BUN 23*  --  18  CREATININE 1.22  --  1.22  CALCIUM 9.0  --  8.2*  MG  --  2.0  --   PHOS  --  3.4  --  GFR Estimated Creatinine Clearance: 96.2 mL/min (by C-G formula based on Cr of 1.22). Liver Function Tests:  Recent Labs Lab 09/21/15 2151 09/22/15 0509  AST 21 16  ALT 16* 14*  ALKPHOS 39 35*  BILITOT 0.3 0.6  PROT 6.7 5.9*  ALBUMIN 4.0 3.5    Recent Labs Lab 09/21/15 2151  LIPASE 20   No results for input(s): AMMONIA in the last 168 hours. Coagulation profile  Recent Labs Lab 09/21/15 2332 09/22/15 0509  INR 1.06 1.14    CBC:  Recent Labs Lab 09/21/15 2151 09/22/15 0509 09/22/15 1000 09/23/15 0544 09/24/15 0523  WBC 8.2 9.3 7.1 4.9 5.3  NEUTROABS 4.7 4.7  --   --   --   HGB 13.7 12.6* 13.0 12.1* 12.1*  HCT 38.7* 36.3* 37.7* 34.6* 35.0*  MCV 88.8 91.7 91.7 89.6 91.4  PLT 259 237 236 214 235   Cardiac Enzymes:  Recent Labs Lab 09/22/15 0509 09/22/15 1000  TROPONINI <0.03 <0.03   BNP (last 3 results) No  results for input(s): PROBNP in the last 8760 hours. CBG: No results for input(s): GLUCAP in the last 168 hours. D-Dimer:  Recent Labs  09/21/15 2151  DDIMER 0.31   Hgb A1c: No results for input(s): HGBA1C in the last 72 hours. Lipid Profile:  Recent Labs  09/22/15 1000  CHOL 131  HDL 53  LDLCALC 63  TRIG 75  CHOLHDL 2.5   Thyroid function studies: No results for input(s): TSH, T4TOTAL, T3FREE, THYROIDAB in the last 72 hours.  Invalid input(s): FREET3 Anemia work up: No results for input(s): VITAMINB12, FOLATE, FERRITIN, TIBC, IRON, RETICCTPCT in the last 72 hours. Sepsis Labs:  Recent Labs Lab 09/22/15 0509 09/22/15 1000 09/23/15 0544 09/24/15 0523  WBC 9.3 7.1 4.9 5.3   Microbiology No results found for this or any previous visit (from the past 240 hour(s)).   Medications:   . ciprofloxacin-hydrocortisone  3 drop Right Ear BID  . metoCLOPramide (REGLAN) injection  5 mg Intravenous TID AC & HS  . nicotine  21 mg Transdermal Daily  . pantoprazole  40 mg Oral BID AC  . sodium chloride flush  3 mL Intravenous Q12H  . sucralfate  1 g Oral TID WC & HS   Continuous Infusions:   Time spent: 15 min   LOS: 3 days   Marinda Elk  [facility] Pager [phone]  *Please refer to [email or web address], password [email or web address] to get updated schedule on who will round on this patient, as hospitalists switch teams weekly. If 7PM-7AM, please contact night-coverage at [email or web address], password [email or web address] for any overnight needs.  09/24/2015, 9:54 AM

## 2015-09-24 NOTE — Progress Notes (Signed)
Subjective: Still with hiccoughs, odynophagia, chest discomfort. No hematemesis.  Objective: Vital signs in last 24 hours: Temp:  [97.8 F (36.6 C)-98.7 F (37.1 C)] 97.8 F (36.6 C) (04/26 0528) Pulse Rate:  [69-116] 69 (04/26 0528) Resp:  [17-27] 19 (04/26 0528) BP: (127-157)/(55-95) 127/55 mmHg (04/26 0528) SpO2:  [97 %-100 %] 97 % (04/26 0528) Weight change:  Last BM Date: 09/23/15  PE: GEN:  NAD  Lab Results: CBC    Component Value Date/Time   WBC 5.3 09/24/2015 0523   RBC 3.83* 09/24/2015 0523   HGB 12.1* 09/24/2015 0523   HCT 35.0* 09/24/2015 0523   PLT 235 09/24/2015 0523   MCV 91.4 09/24/2015 0523   MCH 31.6 09/24/2015 0523   MCHC 34.6 09/24/2015 0523   RDW 13.2 09/24/2015 0523   LYMPHSABS 3.9 09/22/2015 0509   MONOABS 0.5 09/22/2015 0509   EOSABS 0.1 09/22/2015 0509   BASOSABS 0.0 09/22/2015 0509   CMP     Component Value Date/Time   NA 140 09/22/2015 0509   K 4.5 09/22/2015 0509   CL 110 09/22/2015 0509   CO2 26 09/22/2015 0509   GLUCOSE 104* 09/22/2015 0509   BUN 18 09/22/2015 0509   CREATININE 1.22 09/22/2015 0509   CALCIUM 8.2* 09/22/2015 0509   PROT 5.9* 09/22/2015 0509   ALBUMIN 3.5 09/22/2015 0509   AST 16 09/22/2015 0509   ALT 14* 09/22/2015 0509   ALKPHOS 35* 09/22/2015 0509   BILITOT 0.6 09/22/2015 0509   GFRNONAA >60 09/22/2015 0509   GFRAA >60 09/22/2015 0509   Assessment:  1.  Coffee ground emesis, resolved. 2.  Chest pain and odynophagia, persistent, likely from reflux esophagitis noted on yesterday's endoscopy. 3.  Hiccoughs.  Likely from reflux esophagitis.  Persistent despite thorazine. 4.  GERD.  Plan:  1.  Pantoprazole bid. 2.  Sucralfate qac/qhs. 3.  Stop thorazine. 4.  Trial of metoclopramide 5 mg IV qac/qhs. 5.  Continue soft diet, which is likely better than liquid (especially clear liquid) diet, largely acidic contents of which would be likely to exacerbate his GERD symptoms. 6.  Will follow; hopefully patient  can be discharged home in the next day or two.    Caleb Mcclure,Caleb Mcclure 09/24/2015, 8:36 AM   Pager [phone] If no answer or after 5 PM call [phone]

## 2015-09-25 MED ORDER — CHLORPROMAZINE HCL 50 MG PO TABS: 50.0000 mg | ORAL_TABLET | Freq: Three times a day (TID) | ORAL | Status: AC | PRN

## 2015-09-25 MED ORDER — PANTOPRAZOLE SODIUM 40 MG PO TBEC: 40.0000 mg | DELAYED_RELEASE_TABLET | Freq: Two times a day (BID) | ORAL | Status: AC

## 2015-09-25 MED ORDER — SUCRALFATE 1 GM/10ML PO SUSP: 1.0000 g | Freq: Three times a day (TID) | ORAL | Status: DC

## 2015-09-25 MED FILL — ?PANTOPRAZOLE SOD DR 40MG: 40 MG | 30 days supply | Qty: 60 | Fill #0

## 2015-09-25 MED FILL — CARAFATE 1 GM/10 ML SUSP: 1 | 11 days supply | Qty: 420 | Fill #0

## 2015-09-25 NOTE — Progress Notes (Signed)
Subjective: Still with hiccoughs.  Objective: Vital signs in last 24 hours: Temp:  [97.9 F (36.6 C)-98.1 F (36.7 C)] 98.1 F (36.7 C) (04/27 0556) Pulse Rate:  [57-74] 74 (04/27 0556) Resp:  [18-22] 20 (04/27 0556) BP: (109-133)/(58-85) 123/69 mmHg (04/27 0556) SpO2:  [100 %] 100 % (04/27 0556) Weight change:  Last BM Date: 09/24/15  PE: GEN:  NAD  Lab Results: CBC    Component Value Date/Time   WBC 5.3 09/24/2015 0523   RBC 3.83* 09/24/2015 0523   HGB 12.1* 09/24/2015 0523   HCT 35.0* 09/24/2015 0523   PLT 235 09/24/2015 0523   MCV 91.4 09/24/2015 0523   MCH 31.6 09/24/2015 0523   MCHC 34.6 09/24/2015 0523   RDW 13.2 09/24/2015 0523   LYMPHSABS 3.9 09/22/2015 0509   MONOABS 0.5 09/22/2015 0509   EOSABS 0.1 09/22/2015 0509   BASOSABS 0.0 09/22/2015 0509   CMP     Component Value Date/Time   NA 140 09/22/2015 0509   K 4.5 09/22/2015 0509   CL 110 09/22/2015 0509   CO2 26 09/22/2015 0509   GLUCOSE 104* 09/22/2015 0509   BUN 18 09/22/2015 0509   CREATININE 1.22 09/22/2015 0509   CALCIUM 8.2* 09/22/2015 0509   PROT 5.9* 09/22/2015 0509   ALBUMIN 3.5 09/22/2015 0509   AST 16 09/22/2015 0509   ALT 14* 09/22/2015 0509   ALKPHOS 35* 09/22/2015 0509   BILITOT 0.6 09/22/2015 0509   GFRNONAA >60 09/22/2015 0509   GFRAA >60 09/22/2015 0509   Assessment:  1. Coffee ground emesis, resolved. 2. Chest pain and odynophagia, persistent, likely from reflux esophagitis noted on endoscopy this admission. 3. Hiccoughs. Likely from reflux esophagitis. Persistent despite thorazine and metoclopramide, as well as intensive therapy for GERD (PPI + sucralfate). 4. GERD.  Plan:  1.  Continue pantoprazole and sucralfate. 2.  Continue soft diet. 3.  Patient is extremely anxious about his hiccoughs and doesn't feel he can go home and function at his current capacity.  I discussed at length that the hiccoughs could take several days to a couple weeks to improve, even with  GERD treatment as well as metoclopramide (or other pharmacotherapy directed at hiccoughs).   4.  Please arrange ENT consultation at patient's request; he wants to make sure there is nothing else that can be done for these hiccoughs. 5.  Nothing else to offer from GI perspective. 6.  Will sign-off; will arrange outpatient follow-up with [facility] ([phone]); thank you for the consultation; please call with questions.   Caleb JakschOUTLAW,Christne Platts M 09/25/2015, 8:51 AM   Pager [phone] If no answer or after 5 PM call [phone]

## 2015-09-25 NOTE — Care Management Note (Signed)
Case Management Note  Patient Details  Name: Caleb Mcclure MRN: [id number] Date of Birth: December 02, 1970  Subjective/Objective:     [facility].Patient voiced understanding.               Action/Plan:d/c home no further needs or orders.   Expected Discharge Date:                 Expected Discharge Plan:  Home/Self Care  In-House Referral:     Discharge planning Services  CM Consult, [facility]  Post Acute Care Choice:    Choice offered to:     DME Arranged:    DME Agency:     HH Arranged:    HH Agency:     Status of Service:  Completed, signed off  Medicare Important Message Given:    Date Medicare IM Given:    Medicare IM give by:    Date Additional Medicare IM Given:    Additional Medicare Important Message give by:     If discussed at Long Length of Stay Meetings, dates discussed:    Additional Comments:  Lanier ClamMahabir, Isac Lincks, RN 09/25/2015, 10:34 AM

## 2015-09-25 NOTE — Discharge Summary (Signed)
Physician Discharge Summary  Caleb Mcclure ONG:[id number] DOB: [birth year] DOA: 09/21/2015  PCP: No primary care provider on file.  Admit date: 09/21/2015 Discharge date: 09/25/2015  Time spent: 35minutes  Recommendations for Outpatient Follow-up:  1. Follow-up with GI in 2-4 weeks as an outpatient.   Discharge Diagnoses:  Principal Problem:   Acute esophagitis Active Problems:   Tobacco abuse disorder   Atypical chest pain   Hiccups   Alcohol use (HCC)   Earache   Discharge Condition: stable  Diet recommendation: regular  Filed Weights   09/22/15 0029  Weight: 100.109 kg (220 lb 11.2 oz)    History of present illness:  45 year old male with no significant past medical history comes into the hospital for hiccups and chest pain with epigastric pain that started 3 days prior to admission  Hospital Course:  Acute coffee-ground emesis due to acute esophagitis/take up: He was started on IV Protonix GI was consulted recommended an EGD that show esophagitis stage III no ulcers it was performed on 09/23/2015. GI recommended to continue IV Protonix twice a day plus Carafate. Abdomen remains stable he will follow-up with GI as an outpatient in 2 weeks.  Tobacco abuse: Counseling.  Alcohol abuse: No signs of withdrawal.   Procedures:  EGD  Consultations:  GI  Discharge Exam: Filed Vitals:   09/24/15 2206 09/25/15 0556  BP: 109/58 123/69  Pulse: 57 74  Temp: 98.1 F (36.7 C) 98.1 F (36.7 C)  Resp: 22 20    General: A&O x3 Cardiovascular: RRR Respiratory: good air movement CTA B/L  Discharge Instructions   Discharge Instructions    Diet - low sodium heart healthy    Complete by:  As directed      Increase activity slowly    Complete by:  As directed           Current Discharge Medication List    START taking these medications   Details  chlorproMAZINE (THORAZINE) 50 MG tablet Take 1 tablet (50 mg total) by mouth 3 (three) times daily as  needed. Qty: 30 tablet, Refills: 0    pantoprazole (PROTONIX) 40 MG tablet Take 1 tablet (40 mg total) by mouth 2 (two) times daily before a meal. Qty: 60 tablet, Refills: 3    sucralfate (CARAFATE) 1 GM/10ML suspension Take 10 mLs (1 g total) by mouth 4 (four) times daily -  with meals and at bedtime. Qty: 420 mL, Refills: 1       No Known Allergies Follow-up Information    Follow up with [facility] On 10/06/2015.   Why:  @10 :30a hospital f/u;bring meds in bottle/photo id   Contact information:   [address] [place] [phone]      Follow up with Freddy JakschUTLAW,WILLIAM M, MD In 2 weeks.   Specialty:  Gastroenterology   Contact information:   [address] [place] [address] [phone]        The results of significant diagnostics from this hospitalization (including imaging, microbiology, ancillary and laboratory) are listed below for reference.    Significant Diagnostic Studies: Dg Chest 2 View  09/21/2015  CLINICAL DATA:  Shortness of breath EXAM: CHEST  2 VIEW COMPARISON:  None. FINDINGS: The heart size and mediastinal contours are within normal limits. Both lungs are clear. The visualized skeletal structures are unremarkable. IMPRESSION: No active cardiopulmonary disease. Electronically Signed   By: Marnee SpringJonathon  Watts M.D.   On: 09/21/2015 22:05    Microbiology:  No results found for this or any previous visit (from the past 240 hour(s)).   Labs: Basic Metabolic Panel:  Recent Labs Lab 09/21/15 2151 09/21/15 2332 09/22/15 0509  NA 137  --  140  K 4.0  --  4.5  CL 104  --  110  CO2 26  --  26  GLUCOSE 108*  --  104*  BUN 23*  --  18  CREATININE 1.22  --  1.22  CALCIUM 9.0  --  8.2*  MG  --  2.0  --   PHOS  --  3.4  --    Liver Function Tests:  Recent Labs Lab 09/21/15 2151 09/22/15 0509  AST 21 16  ALT 16* 14*  ALKPHOS 39 35*  BILITOT 0.3 0.6  PROT 6.7 5.9*  ALBUMIN 4.0  3.5    Recent Labs Lab 09/21/15 2151  LIPASE 20   No results for input(s): AMMONIA in the last 168 hours. CBC:  Recent Labs Lab 09/21/15 2151 09/22/15 0509 09/22/15 1000 09/23/15 0544 09/24/15 0523  WBC 8.2 9.3 7.1 4.9 5.3  NEUTROABS 4.7 4.7  --   --   --   HGB 13.7 12.6* 13.0 12.1* 12.1*  HCT 38.7* 36.3* 37.7* 34.6* 35.0*  MCV 88.8 91.7 91.7 89.6 91.4  PLT 259 237 236 214 235   Cardiac Enzymes:  Recent Labs Lab 09/22/15 0509 09/22/15 1000  TROPONINI <0.03 <0.03   BNP: BNP (last 3 results) No results for input(s): BNP in the last 8760 hours.  ProBNP (last 3 results) No results for input(s): PROBNP in the last 8760 hours.  CBG: No results for input(s): GLUCAP in the last 168 hours.     Signed:  Marinda Elk MD.  [facility] 09/25/2015, 9:03 AM

## 2015-10-06 ENCOUNTER — Inpatient Hospital Stay: Payer: Self-pay | Admitting: Family Medicine

## 2017-10-01 IMAGING — CR DG CHEST 2V
2 series · 2 of 2 positions shown · non-contrast
Comparison: None.

CLINICAL DATA: Shortness of breath

EXAM:
CHEST  2 VIEW

[w chest pa]
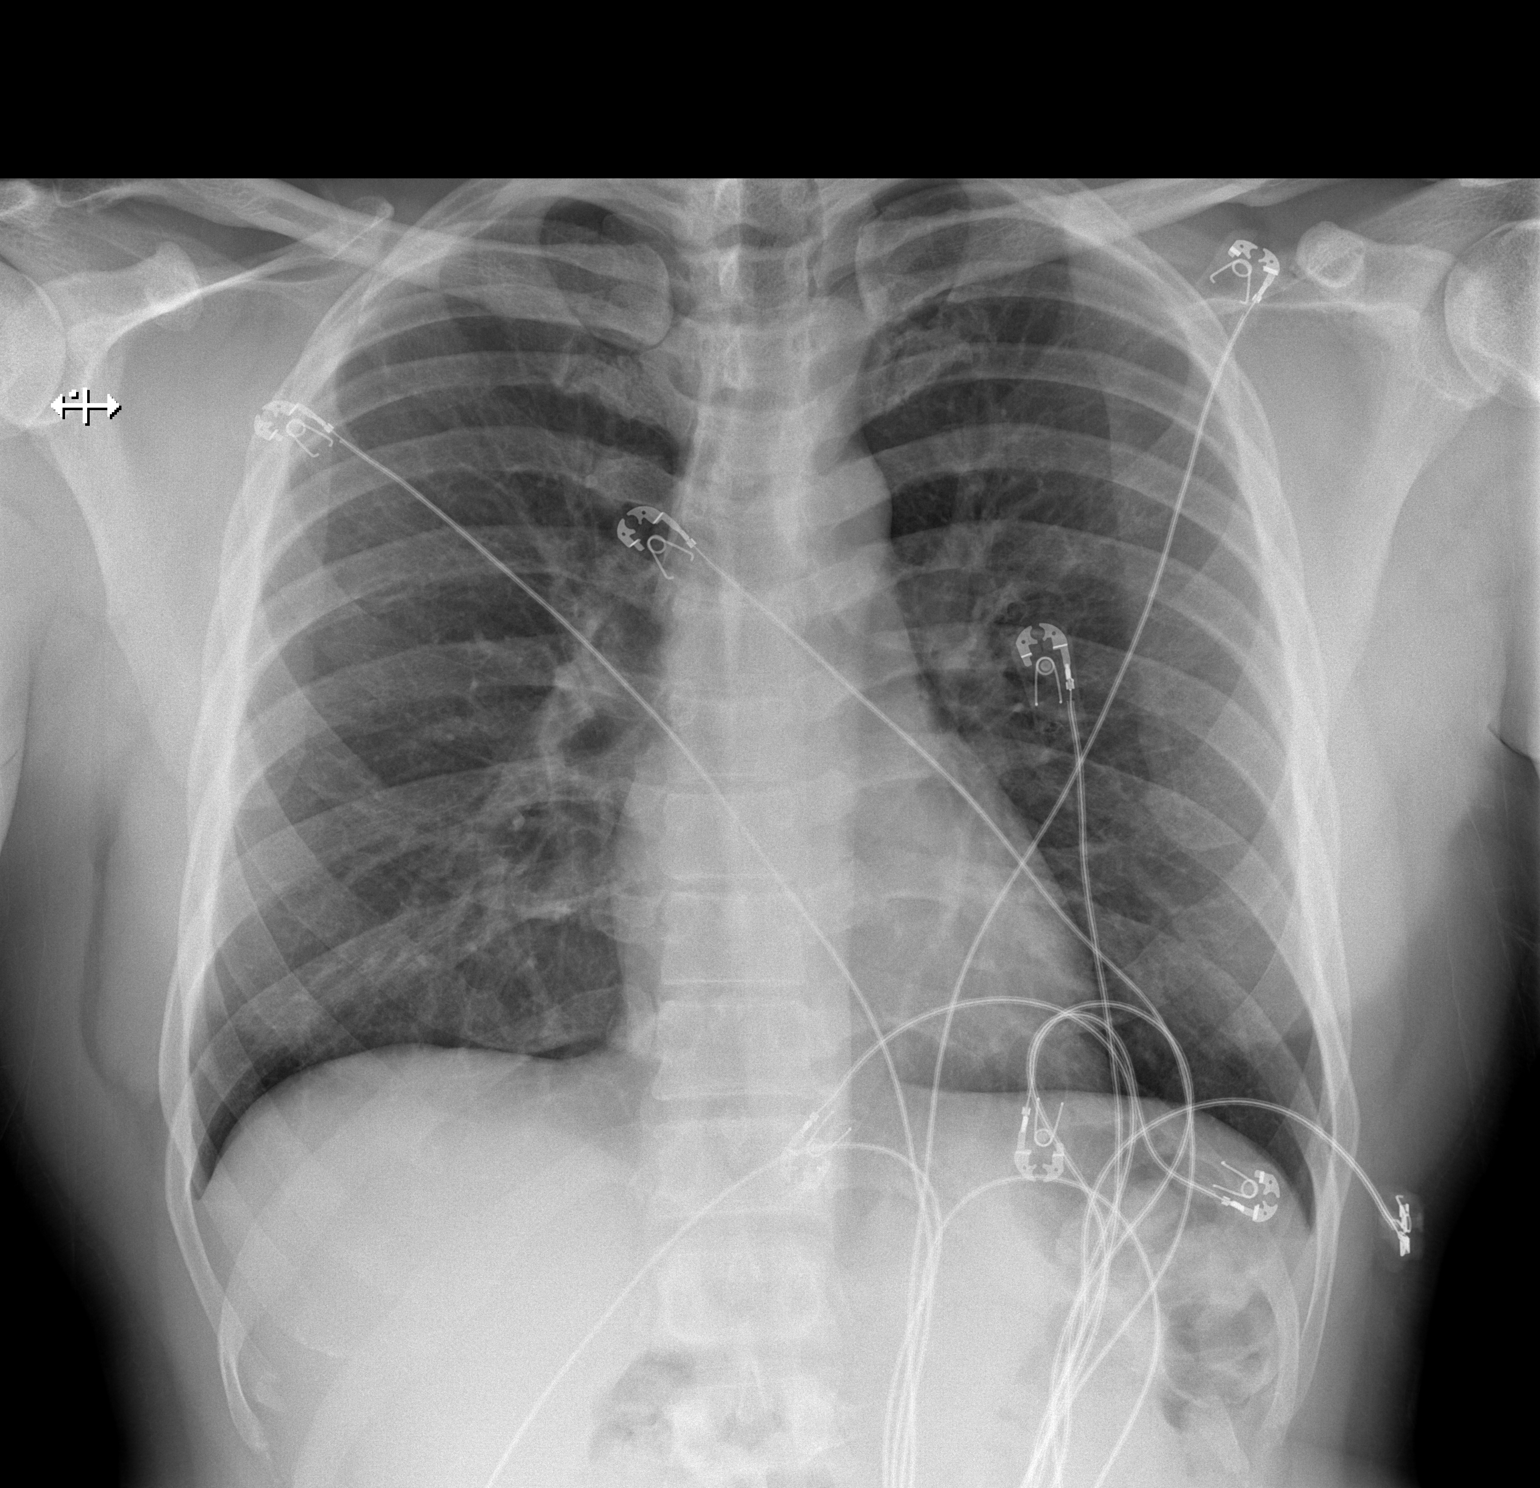

[w chest lat]
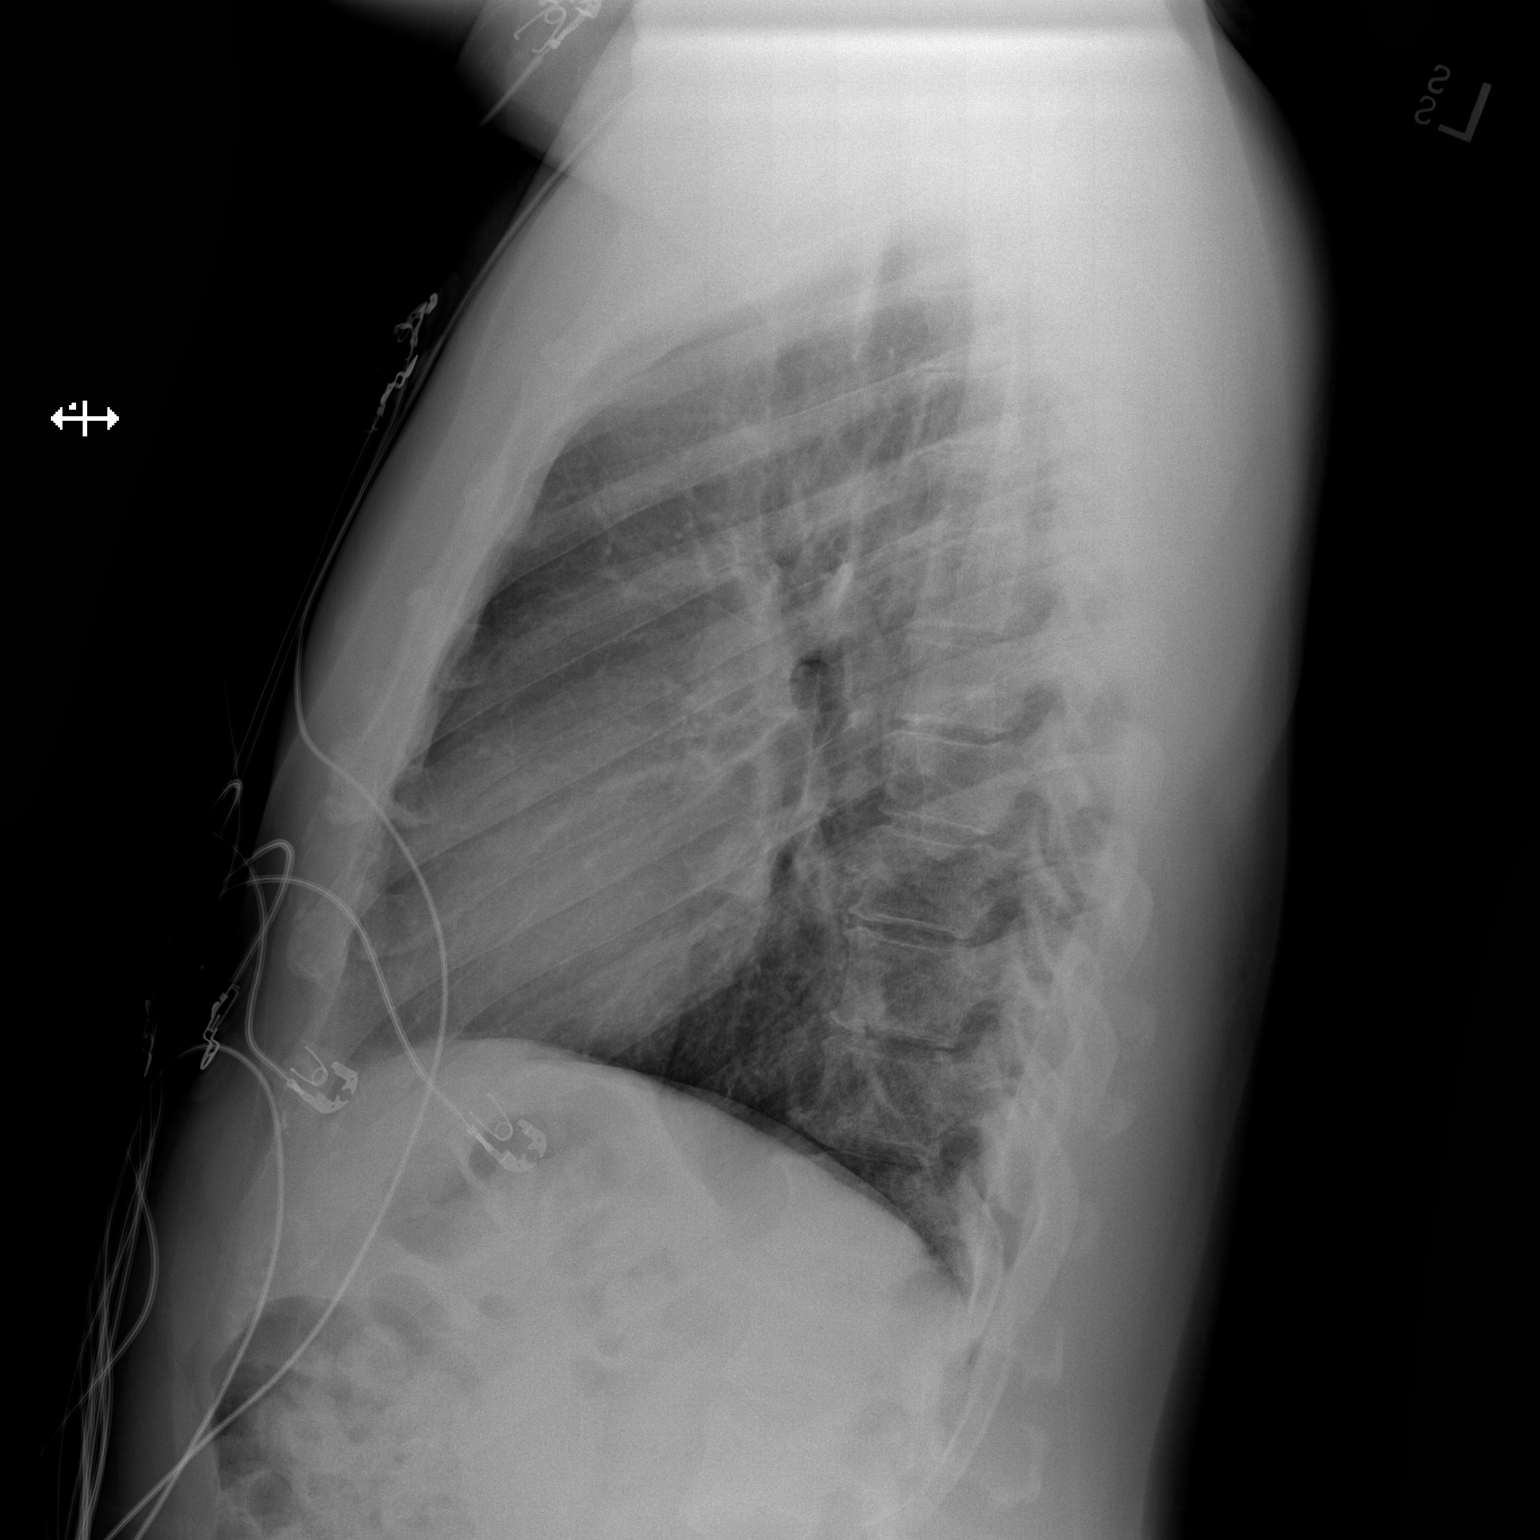

[2 of 2 positions shown; findings below may reference images not displayed]

FINDINGS: The heart size and mediastinal contours are within normal limits.
Both lungs are clear. The visualized skeletal structures are
unremarkable.
IMPRESSION: No active cardiopulmonary disease.

## 2019-05-30 ENCOUNTER — Other Ambulatory Visit: Payer: Self-pay

## 2019-05-30 DIAGNOSIS — Z20822 Contact with and (suspected) exposure to covid-19: Secondary | ICD-10-CM

## 2019-05-31 LAB — NOVEL CORONAVIRUS, NAA: SARS-CoV-2, NAA: NOT DETECTED

## 2019-06-06 ENCOUNTER — Other Ambulatory Visit: Payer: Self-pay

## 2019-06-06 DIAGNOSIS — Z20822 Contact with and (suspected) exposure to covid-19: Secondary | ICD-10-CM

## 2019-06-07 LAB — NOVEL CORONAVIRUS, NAA: SARS-CoV-2, NAA: NOT DETECTED

## 2021-12-29 ENCOUNTER — Ambulatory Visit
Admission: RE | Admit: 2021-12-29 | Discharge: 2021-12-29 | Disposition: A | Discharge status: Home / Self Care | Payer: 59 | Source: Ambulatory Visit | Attending: Family Medicine | Admitting: Family Medicine

## 2021-12-29 ENCOUNTER — Other Ambulatory Visit: Payer: Self-pay | Admitting: Family Medicine

## 2021-12-29 DIAGNOSIS — R079 Chest pain, unspecified: Secondary | ICD-10-CM

## 2022-03-03 ENCOUNTER — Ambulatory Visit: Payer: 59 | Attending: Internal Medicine | Admitting: Internal Medicine

## 2022-03-03 ENCOUNTER — Encounter: Payer: Self-pay | Admitting: Internal Medicine

## 2022-03-03 VITALS — BP 110/68 | HR 74 | Ht 72.0 in | Wt 211.4 lb

## 2022-03-03 DIAGNOSIS — R0989 Other specified symptoms and signs involving the circulatory and respiratory systems: Secondary | ICD-10-CM

## 2022-03-03 DIAGNOSIS — K209 Esophagitis, unspecified without bleeding: Secondary | ICD-10-CM

## 2022-03-03 DIAGNOSIS — R072 Precordial pain: Secondary | ICD-10-CM

## 2022-03-03 DIAGNOSIS — Z720 Tobacco use: Secondary | ICD-10-CM

## 2022-03-03 DIAGNOSIS — Z789 Other specified health status: Secondary | ICD-10-CM | POA: Diagnosis not present

## 2022-03-03 DIAGNOSIS — Z8249 Family history of ischemic heart disease and other diseases of the circulatory system: Secondary | ICD-10-CM

## 2022-03-03 NOTE — Progress Notes (Signed)
Cardiology Office Note:    Date:  03/03/2022   ID:  Caleb Mcclure, DOB 05/19/1971, MRN [id number]  PCP:  Lucianne Lei, MD   [facility] Providers Cardiologist:  None     Referring MD: Sandria Bales, *   CC: family history of heart failure. Consulted for the evaluation of chest pain at the behest of Ms. Grandville Silos NP  History of Present Illness:    Caleb Mcclure is a 51 y.o. male with a hx of tobacco abuse, alcohol use, with chest pain.  Patient notes that he is feeling ok.   Swims three times a week 45 minutes to an hour with no symptoms.  Has chest burning but not cardiac chest pain.  Had right arm tingling.  Chest burning worse with alcohol and tobacco abuse.  No shortness of breath, DOE .  No PND or orthopnea.  No weight gain, leg swelling , or abdominal swelling.  No syncope or near syncope . Notes  no palpitations or funny heart beats.     No prior cardiac testing including.  Past Medical History:  Diagnosis Date   Acid reflux    Anxiety    Chest pain    Depression    Hx of vertigo     Past Surgical History:  Procedure Laterality Date   ESOPHAGOGASTRODUODENOSCOPY (EGD) WITH PROPOFOL Left 09/23/2015   Procedure: ESOPHAGOGASTRODUODENOSCOPY (EGD) WITH PROPOFOL;  Surgeon: Arta Silence, MD;  Location: [facility];  Service: Endoscopy;  Laterality: Left;    Current Medications: Current Meds  Medication Sig   chlorproMAZINE (THORAZINE) 50 MG tablet Take 1 tablet (50 mg total) by mouth 3 (three) times daily as needed.   diclofenac Sodium (VOLTAREN) 1 % GEL Apply topically as needed for pain.   omeprazole (PRILOSEC) 20 MG capsule Take 20 mg by mouth daily.   pantoprazole (PROTONIX) 40 MG tablet Take 1 tablet (40 mg total) by mouth 2 (two) times daily before a meal.   sucralfate (CARAFATE) 1 GM/10ML suspension Take 1 g by mouth as needed (heartburn).     Allergies:   Patient has no known allergies.   Social History   Socioeconomic History   Marital  status: Single    Spouse name: Not on file   Number of children: Not on file   Years of education: Not on file   Highest education level: Not on file  Occupational History   Not on file  Tobacco Use   Smoking status: Every Day   Smokeless tobacco: Not on file  Substance and Sexual Activity   Alcohol use: Yes   Drug use: No   Sexual activity: Not on file  Other Topics Concern   Not on file  Social History Narrative   Not on file   Social Determinants of Health   Financial Resource Strain: Not on file  Food Insecurity: Not on file  Transportation Needs: Not on file  Physical Activity: Not on file  Stress: Not on file  Social Connections: Not on file    Family History: History of heart failure notable for grandmother.  ROS:   Please see the history of present illness.     All other systems reviewed and are negative.  EKGs/Labs/Other Studies Reviewed:    The following studies were reviewed today:  EKG:  EKG is  ordered today.  The ekg ordered today demonstrates  03/03/22: SR  Recent Labs: No results found for requested labs within last 365 days.  Recent Lipid Panel    Component  Value Date/Time   CHOL 131 09/22/2015 1000   TRIG 75 09/22/2015 1000   HDL 53 09/22/2015 1000   CHOLHDL 2.5 09/22/2015 1000   VLDL 15 09/22/2015 1000   LDLCALC 63 09/22/2015 1000    Physical Exam:    VS:  BP 110/68   Pulse 74   Ht 6' (1.829 m)   Wt 211 lb 6.4 oz (95.9 kg)   SpO2 99%   BMI 28.67 kg/m     Wt Readings from Last 3 Encounters:  03/03/22 211 lb 6.4 oz (95.9 kg)  09/22/15 220 lb 11.2 oz (100.1 kg)    Gen: no distress Neck: No JVD Cardiac: No Rubs or Gallops, No murmur, RRR +2 radial pulses Respiratory: left lung field crackles normal effort, normal  respiratory rate GI: Soft, nontender, non-distended  MS: No  edema;  moves all extremities Integument: Skin feels warm Neuro:  At time of evaluation, alert and oriented to person/place/time/situation  Psych: Normal  affect, patient feels fine  ASSESSMENT:    1. Tobacco abuse disorder   2. Alcohol use   3. Acute esophagitis   4. Lung crackles   5. Precordial chest pain   6. Family history of heart failure    PLAN:    Family history of HF Chest pain Crackles Tobacco Abuse Alcohol abuse HX of esophagitis - needs to stop tobacco and cut down alcohol - CXR WNL reviewed with patient - will get echo - If normal can get PCP f/u   Medication Adjustments/Labs and Tests Ordered: Current medicines are reviewed at length with the patient today.  Concerns regarding medicines are outlined above.  No orders of the defined types were placed in this encounter.  No orders of the defined types were placed in this encounter.   There are no Patient Instructions on file for this visit.   Signed, Christell Constant, MD  03/03/2022 3:32 PM    [facility]

## 2022-03-03 NOTE — Patient Instructions (Addendum)
Medication Instructions:  Your physician recommends that you continue on your current medications as directed. Please refer to the Current Medication list given to you today.  *If you need a refill on your cardiac medications before your next appointment, please call your pharmacy*   Lab Work: NONE  If you have labs (blood work) drawn today and your tests are completely normal, you will receive your results only by: [facility] (if you have MyChart) OR A paper copy in the mail If you have any lab test that is abnormal or we need to change your treatment, we will call you to review the results.   Testing/Procedures: Your physician has requested that you have an echocardiogram. Echocardiography is a painless test that uses sound waves to create images of your heart. It provides your doctor with information about the size and shape of your heart and how well your heart's chambers and valves are working. This procedure takes approximately one hour. There are no restrictions for this procedure.   Follow-Up: As needed  At [facility], you and your health needs are our priority.  As part of our continuing mission to provide you with exceptional heart care, we have created designated Provider Care Teams.  These Care Teams include your primary Cardiologist (physician) and Advanced Practice Providers (APPs -  Physician Assistants and Nurse Practitioners) who all work together to provide you with the care you need, when you need it.   Provider:  Rudean Haskell, MD   Important Information About Sugar

## 2022-03-16 ENCOUNTER — Ambulatory Visit (HOSPITAL_COMMUNITY): Payer: 59 | Attending: Internal Medicine

## 2022-03-16 DIAGNOSIS — R072 Precordial pain: Secondary | ICD-10-CM | POA: Insufficient documentation

## 2022-03-16 DIAGNOSIS — R0989 Other specified symptoms and signs involving the circulatory and respiratory systems: Secondary | ICD-10-CM | POA: Insufficient documentation

## 2022-03-16 DIAGNOSIS — K209 Esophagitis, unspecified without bleeding: Secondary | ICD-10-CM | POA: Diagnosis not present

## 2022-03-16 DIAGNOSIS — Z8249 Family history of ischemic heart disease and other diseases of the circulatory system: Secondary | ICD-10-CM | POA: Insufficient documentation

## 2022-03-17 LAB — ECHOCARDIOGRAM COMPLETE
Area-P 1/2: 3.12 cm2
P 1/2 time: 267 msec
S' Lateral: 2.8 cm

## 2022-08-30 DEATH — deceased
